# Patient Record
Sex: Female | Born: 1990 | Hispanic: Yes | Marital: Single | Smoking: Never smoker
Health system: Southern US, Community
[De-identification: ages and names within clinical notes are randomized; demographics above are authoritative.]

## PROBLEM LIST (undated history)

## (undated) ENCOUNTER — Inpatient Hospital Stay: Payer: Self-pay

## (undated) DIAGNOSIS — N159 Renal tubulo-interstitial disease, unspecified: Secondary | ICD-10-CM

## (undated) DIAGNOSIS — Z3490 Encounter for supervision of normal pregnancy, unspecified, unspecified trimester: Secondary | ICD-10-CM

## (undated) HISTORY — PX: NO PAST SURGERIES: SHX2092

---
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
## 2015-07-14 NOTE — L&D Delivery Note (Signed)
Delivery Note At 9:46 PM a vigorous female infant was delivered via Vaginal, Spontaneous Delivery (Presentation: Left Occiput Anterior).  APGAR: 8, 9; weight  .   Placenta status: Intact, Spontaneous.  Cord: 3 vessels with the following complications: Short.    Anesthesia: Epidural  Episiotomy: None Lacerations: 2nd degree Suture Repair: rectal capsule was reinforced with 2-0 Vicryl and second degree repaired with 2-0 Vicryl and 3-0 Chromic Est. Blood Loss (mL): 450  Mom to postpartum.  Baby to Couplet care / Skin to Skin.  Naarah Borgerding 11/06/2015, 10:55 PM

## 2015-09-02 ENCOUNTER — Inpatient Hospital Stay
Admission: EM | Admit: 2015-09-02 | Discharge: 2015-09-02 | Disposition: A | Discharge status: Home / Self Care | Payer: Medicaid Other | Attending: Obstetrics and Gynecology | Admitting: Obstetrics and Gynecology

## 2015-09-02 DIAGNOSIS — O26893 Other specified pregnancy related conditions, third trimester: Secondary | ICD-10-CM | POA: Insufficient documentation

## 2015-09-02 DIAGNOSIS — Z3A31 31 weeks gestation of pregnancy: Secondary | ICD-10-CM | POA: Insufficient documentation

## 2015-09-02 DIAGNOSIS — M533 Sacrococcygeal disorders, not elsewhere classified: Secondary | ICD-10-CM | POA: Diagnosis not present

## 2015-09-02 DIAGNOSIS — M545 Low back pain: Secondary | ICD-10-CM | POA: Diagnosis present

## 2015-09-02 LAB — URINALYSIS COMPLETE WITH MICROSCOPIC (ARMC ONLY)
BILIRUBIN URINE: NEGATIVE
Bacteria, UA: NONE SEEN
GLUCOSE, UA: NEGATIVE mg/dL
Hgb urine dipstick: NEGATIVE
KETONES UR: NEGATIVE mg/dL
Nitrite: NEGATIVE
Protein, ur: NEGATIVE mg/dL
RBC / HPF: NONE SEEN RBC/hpf (ref 0–5)
Specific Gravity, Urine: 1.01 (ref 1.005–1.030)
pH: 7 (ref 5.0–8.0)

## 2015-09-03 NOTE — Final Progress Note (Signed)
Physician Final Progress Note  Patient ID: Dawn Simon MRN: [id number] DOB/AGE: 1991/05/28 25 y.o.  Admit date: 09/02/2015 Admitting provider: Vena Austria, MD Discharge date: 09/03/2015   Admission Diagnoses: Low back pain in pregnancy  Discharge Diagnoses:  Same as above-probably MSK in origin IUP at 31wk3d Consults:None  Significant Findings/ Diagnostic Studies: 25 year old G1 P0 with EDC=01 November 2015 by a 5wk6d ultrasound presents to L&D with c/o lower back pain since Friday, when she flew back from [place]. The pain is in the left SI joint and mid sacral area. Heat has not helped. Movement makes the pain worse. Has not taken any analgesics for the pain. Has had some urinary frequency, but no dysuria or bad odor to her urine. No hematuria or fever. No history of back injury and no recent falls. Medical history is significant for pyelonephritis resulting in a hospitalization x 1-2 weeks about 2 years ago. Has been feeling good FM. Has an occasional contraction. No vaginal bleeding or LOF. Prenatal care in [place] (has some records with her) has been uncomplicated. A recent scan reveals good growth. Current medications: Prenatal vitamins Exam: BP 115/60 mmHg  Pulse 82  Temp(Src) 98.4 F (36.9 C) (Oral)  Resp 16  Ht  (1.549 m)  Wt 76.204 kg (168 lb)  BMI 31.76 kg/m2  General: Appears comfortable until she moves, then has pain in her lower back Back: tenderness over her left SI joint and mid sacrum. No CVAT Abdomen: no tenderness FHR: 135 baseline with accelerations to 170s to 180s and moderate variability Toco: occasional contraction, one in the last hour Cervix: FT/40-50%/-2, blood on glove after exam Ultrasound: breech presentation Results for orders placed or performed during the hospital encounter of 09/02/15 (from the past 24 hour(s))  Urinalysis complete, with microscopic ([facility] only)     Status: Abnormal   Collection Time: 09/02/15  7:11 PM  Result Value Ref  Range   Color, Urine YELLOW (A) YELLOW   APPearance CLEAR (A) CLEAR   Glucose, UA NEGATIVE NEGATIVE mg/dL   Bilirubin Urine NEGATIVE NEGATIVE   Ketones, ur NEGATIVE NEGATIVE mg/dL   Specific Gravity, Urine 1.010 1.005 - 1.030   Hgb urine dipstick NEGATIVE NEGATIVE   pH 7.0 5.0 - 8.0   Protein, ur NEGATIVE NEGATIVE mg/dL   Nitrite NEGATIVE NEGATIVE   Leukocytes, UA 1+ (A) NEGATIVE   RBC / HPF NONE SEEN 0 - 5 RBC/hpf   WBC, UA 0-5 0 - 5 WBC/hpf   Bacteria, UA NONE SEEN NONE SEEN   Squamous Epithelial / LPF 0-5 (A) NONE SEEN   Mucous PRESENT   A: SI joint pain and sacral back pain at 31wk3d No evidence of labor Doubt UTI P: Discussed use of Tylenol, heat, maternity support garment and topical rubs like Biofreeze To cal WSOB in AM to transfer in care Urine culture pending  Procedures: NST: reactive  Discharge Condition: stable  Disposition: 81-Discharged to home/self-care with a planned acute care hospital inpt readmission  Diet: Regular diet  Discharge Activity: Activity as tolerated     Medication List    ASK your doctor about these medications        multivitamin-prenatal 27-0.8 MG Tabs tablet  Take 1 tablet by mouth daily at 12 noon.         Total time spent taking care of this patient: 20 minutes  Signed: Farrel Conners 09/03/2015, 3:37 AM

## 2015-09-04 LAB — URINE CULTURE

## 2015-11-05 ENCOUNTER — Observation Stay
Admission: EM | Admit: 2015-11-05 | Discharge: 2015-11-05 | Disposition: A | Discharge status: Home / Self Care | Payer: Medicaid Other | Source: Home / Self Care | Admitting: Obstetrics and Gynecology

## 2015-11-05 DIAGNOSIS — O471 False labor at or after 37 completed weeks of gestation: Secondary | ICD-10-CM

## 2015-11-05 DIAGNOSIS — Z3A40 40 weeks gestation of pregnancy: Secondary | ICD-10-CM | POA: Insufficient documentation

## 2015-11-05 DIAGNOSIS — O479 False labor, unspecified: Secondary | ICD-10-CM | POA: Diagnosis present

## 2015-11-05 HISTORY — DX: Renal tubulo-interstitial disease, unspecified: N15.9

## 2015-11-05 HISTORY — DX: Encounter for supervision of normal pregnancy, unspecified, unspecified trimester: Z34.90

## 2015-11-05 NOTE — OB Triage Note (Signed)
R/o labor, painful contraction since yesterday, becoming more painful, more frequent and steady today. Cervix yesterday in clinic 1cm. GBS unknown.

## 2015-11-05 NOTE — Final Progress Note (Signed)
Physician Final Progress Note  Patient ID: Alphonsa OverallSilvia Pinney MRN: [id number] DOB/AGE: 25/12/1990 25 y.o.  Admit date: 11/05/2015 Admitting provider: Vena AustriaAndreas Veto Macqueen, MD Discharge date: 11/05/2015   Admission Diagnoses: Round ligament pain, irregular contractions  Discharge Diagnoses:  Active Problems:   Irregular contractions   No cervical change over 2-hrs per nursing check, irregular contractions, reactive NST.  Has IOL set for this Friday for postdates  Consults: None  Significant Findings/ Diagnostic Studies: none  Procedures: reactive NST  Discharge Condition: good  Disposition: 81-Discharged to home/self-care with a planned acute care hospital inpt readmission  Diet: Regular diet  Discharge Activity: Activity as tolerated  Discharge Instructions    Discharge diet:  No restrictions    Complete by:  As directed      Fetal Kick Count:  Lie on our left side for one hour after a meal, and count the number of times your baby kicks.  If it is less than 5 times, get up, move around and drink some juice.  Repeat the test 30 minutes later.  If it is still less than 5 kicks in an hour, notify your doctor.    Complete by:  As directed      LABOR:  When conractions begin, you should start to time them from the beginning of one contraction to the beginning  of the next.  When contractions are 5 - 10 minutes apart or less and have been regular for at least an hour, you should call your health care provider.    Complete by:  As directed      No sexual activity restrictions    Complete by:  As directed      Notify physician for bleeding from the vagina    Complete by:  As directed      Notify physician for blurring of vision or spots before the eyes    Complete by:  As directed      Notify physician for chills or fever    Complete by:  As directed      Notify physician for fainting spells, "black outs" or loss of consciousness    Complete by:  As directed      Notify physician for  increase in vaginal discharge    Complete by:  As directed      Notify physician for leaking of fluid    Complete by:  As directed      Notify physician for pain or burning when urinating    Complete by:  As directed      Notify physician for pelvic pressure (sudden increase)    Complete by:  As directed      Notify physician for severe or continued nausea or vomiting    Complete by:  As directed      Notify physician for sudden gushing of fluid from the vagina (with or without continued leaking)    Complete by:  As directed      Notify physician for sudden, constant, or occasional abdominal pain    Complete by:  As directed      Notify physician if baby moving less than usual    Complete by:  As directed             Medication List    TAKE these medications        multivitamin-prenatal 27-0.8 MG Tabs tablet  Take 1 tablet by mouth daily at 12 noon.         Total time spent taking care  of this patient: 5 minutes, patient triaged over phone  Signed: Lorrene Reid 11/05/2015, 9:13 PM

## 2015-11-05 NOTE — Progress Notes (Signed)
[facility]/o painful [facility], says she has been contracting since yesterday after seeing CNM in clinic, noticed spotting yesterday after visit, cervix was 1cm. Continued having painful contraction overnight, about every 10-20 mins, pt denies taking any med or doing anything to ease discomfort. Woke this morning and pain in lower abdomen and pelvis is "steady" worse and coming more frequently but still irregular, nausea, no vomiting, rates pain 5-7/10. Pt confirms +FM, denies vaginal bleeding or leaking fluid, GBS unknown. Says she is scheduled for IOL this Friday, postdates. G1P0, EDD 11/01/15

## 2015-11-05 NOTE — Discharge Instructions (Signed)
Get plenty of water and rest. Discharge instructions given, pain relief measures given and patient stated understanding. Pt has scheduled induction for Friday 11/08/2015

## 2015-11-06 ENCOUNTER — Inpatient Hospital Stay: Payer: Medicaid Other | Admitting: Anesthesiology

## 2015-11-06 ENCOUNTER — Inpatient Hospital Stay
Admission: EM | Admit: 2015-11-06 | Discharge: 2015-11-08 | DRG: 775 | Disposition: A | Discharge status: Home / Self Care | Payer: Medicaid Other | Attending: Certified Nurse Midwife | Admitting: Certified Nurse Midwife

## 2015-11-06 DIAGNOSIS — O480 Post-term pregnancy: Principal | ICD-10-CM | POA: Diagnosis present

## 2015-11-06 DIAGNOSIS — Z3A40 40 weeks gestation of pregnancy: Secondary | ICD-10-CM

## 2015-11-06 LAB — TYPE AND SCREEN
ABO/RH(D): O POS
ANTIBODY SCREEN: NEGATIVE

## 2015-11-06 LAB — CBC
HEMATOCRIT: 34.6 % — AB (ref 35.0–47.0)
Hemoglobin: 11.2 g/dL — ABNORMAL LOW (ref 12.0–16.0)
MCH: 22.6 pg — ABNORMAL LOW (ref 26.0–34.0)
MCHC: 32.4 g/dL (ref 32.0–36.0)
MCV: 69.8 fL — ABNORMAL LOW (ref 80.0–100.0)
Platelets: 202 10*3/uL (ref 150–440)
RBC: 4.95 MIL/uL (ref 3.80–5.20)
RDW: 17.3 % — AB (ref 11.5–14.5)
WBC: 11.1 10*3/uL — AB (ref 3.6–11.0)

## 2015-11-06 LAB — CHLAMYDIA/NGC RT PCR (ARMC ONLY)
Chlamydia Tr: NOT DETECTED
N GONORRHOEAE: NOT DETECTED

## 2015-11-06 LAB — ABO/RH: ABO/RH(D): O POS

## 2015-11-06 MED ORDER — FENTANYL 2.5 MCG/ML W/ROPIVACAINE 0.2% IN NS 100 ML EPIDURAL INFUSION (ARMC-ANES)
10.0000 mL/h | EPIDURAL | Status: DC
  Administered 2015-11-06: 10 mL/h via EPIDURAL
  Filled 2015-11-06 (×2): qty 100

## 2015-11-06 MED ORDER — AMMONIA AROMATIC IN INHA
RESPIRATORY_TRACT | Status: AC
  Filled 2015-11-06: qty 10

## 2015-11-06 MED ORDER — BUPIVACAINE HCL (PF) 0.25 % IJ SOLN
INTRAMUSCULAR | Status: DC | PRN
  Administered 2015-11-06: 10 mL via EPIDURAL

## 2015-11-06 MED ORDER — EPHEDRINE 5 MG/ML INJ
10.0000 mg | INTRAVENOUS | Status: DC | PRN
  Filled 2015-11-06: qty 2

## 2015-11-06 MED ORDER — LACTATED RINGERS IV SOLN: 500.0000 mL | Freq: Once | INTRAVENOUS | Status: DC

## 2015-11-06 MED ORDER — BUTORPHANOL TARTRATE 1 MG/ML IJ SOLN
1.0000 mg | INTRAMUSCULAR | Status: DC | PRN
  Administered 2015-11-06: 2 mg via INTRAVENOUS
  Filled 2015-11-06: qty 2

## 2015-11-06 MED ORDER — LIDOCAINE HCL (PF) 1 % IJ SOLN
INTRAMUSCULAR | Status: AC
  Filled 2015-11-06: qty 30

## 2015-11-06 MED ORDER — OXYTOCIN BOLUS FROM INFUSION
500.0000 mL | INTRAVENOUS | Status: DC
  Administered 2015-11-06: 500 mL via INTRAVENOUS

## 2015-11-06 MED ORDER — DIPHENHYDRAMINE HCL 50 MG/ML IJ SOLN: 12.5000 mg | INTRAMUSCULAR | Status: DC | PRN

## 2015-11-06 MED ORDER — OXYTOCIN 10 UNIT/ML IJ SOLN
INTRAMUSCULAR | Status: AC
  Filled 2015-11-06: qty 2

## 2015-11-06 MED ORDER — MISOPROSTOL 200 MCG PO TABS
ORAL_TABLET | ORAL | Status: AC
  Filled 2015-11-06: qty 4

## 2015-11-06 MED ORDER — PHENYLEPHRINE 40 MCG/ML (10ML) SYRINGE FOR IV PUSH (FOR BLOOD PRESSURE SUPPORT)
80.0000 ug | PREFILLED_SYRINGE | INTRAVENOUS | Status: DC | PRN
  Filled 2015-11-06: qty 5

## 2015-11-06 MED ORDER — ACETAMINOPHEN 325 MG PO TABS
650.0000 mg | ORAL_TABLET | ORAL | Status: DC | PRN
Start: 2015-11-06 — End: 2015-11-08

## 2015-11-06 MED ORDER — ONDANSETRON HCL 4 MG/2ML IJ SOLN: 4.0000 mg | Freq: Four times a day (QID) | INTRAMUSCULAR | Status: DC | PRN

## 2015-11-06 MED ORDER — CITRIC ACID-SODIUM CITRATE 334-500 MG/5ML PO SOLN: 30.0000 mL | ORAL | Status: DC | PRN

## 2015-11-06 MED ORDER — LIDOCAINE HCL (PF) 1 % IJ SOLN: 30.0000 mL | INTRAMUSCULAR | Status: DC | PRN

## 2015-11-06 MED ORDER — OXYTOCIN 40 UNITS IN LACTATED RINGERS INFUSION - SIMPLE MED
2.5000 [IU]/h | INTRAVENOUS | Status: DC
  Filled 2015-11-06: qty 1000

## 2015-11-06 MED ORDER — LACTATED RINGERS IV SOLN
INTRAVENOUS | Status: DC
  Administered 2015-11-06 (×2): via INTRAVENOUS

## 2015-11-06 MED ORDER — BUTORPHANOL TARTRATE 1 MG/ML IJ SOLN
1.0000 mg | INTRAMUSCULAR | Status: DC | PRN
  Administered 2015-11-06 (×2): 1 mg via INTRAVENOUS
  Filled 2015-11-06 (×2): qty 1

## 2015-11-06 MED ORDER — LIDOCAINE-EPINEPHRINE (PF) 1.5 %-1:200000 IJ SOLN
INTRAMUSCULAR | Status: DC | PRN
  Administered 2015-11-06: 3 mL via PERINEURAL

## 2015-11-06 MED ORDER — LACTATED RINGERS IV SOLN: 500.0000 mL | INTRAVENOUS | Status: DC | PRN

## 2015-11-06 NOTE — Anesthesia Procedure Notes (Signed)
Epidural  Start time: 11/06/2015 6:18 PM End time: 11/06/2015 6:25 PM  Staffing Anesthesiologist: Yves DillARROLL, Johnmatthew Solorio Performed by: anesthesiologist   Preanesthetic Checklist Completed: patient identified, site marked, surgical consent, pre-op evaluation, timeout performed, IV checked, risks and benefits discussed and monitors and equipment checked  Epidural Patient position: sitting Prep: Betadine and site prepped and draped Patient monitoring: heart rate, cardiac monitor, continuous pulse ox and blood pressure Approach: midline Location: L3-L4 Injection technique: LOR air  Needle:  Needle type: Tuohy  Needle gauge: 18 G Needle length: 9 cm Catheter type: closed end Catheter size: 20 Guage Test dose: negative and 1.5% lidocaine with Epi 1:200 K  Assessment Sensory level: T8  Additional Notes Time out called.  Patient placed in sitting position.  Prepped and draped in sterile fashion.  A skin wheal was made in the L3-L4 interspace with 1% Lidocaine plain.  An 2818 G Tuohy needle was guided into the epidural space by a loss of resistance technique.  The catheter was threaded 3 cm and the TD was negative.  The catheter was affixed to the back in sterile fashion and the patient tolerated the procedure wellReason for block:procedure for pain

## 2015-11-06 NOTE — Anesthesia Preprocedure Evaluation (Addendum)
Anesthesia Evaluation  Patient identified by MRN, date of birth, ID band Patient awake    Reviewed: Allergy & Precautions, NPO status , Patient's Chart, lab work & pertinent test results  Airway Mallampati: II  TM Distance: >3 FB     Dental no notable dental hx.    Pulmonary neg pulmonary ROS,    Pulmonary exam normal        Cardiovascular negative cardio ROS Normal cardiovascular exam     Neuro/Psych negative neurological ROS  negative psych ROS   GI/Hepatic negative GI ROS, Neg liver ROS,   Endo/Other  negative endocrine ROS  Renal/GU Hx of UTI  negative genitourinary   Musculoskeletal negative musculoskeletal ROS (+)   Abdominal Normal abdominal exam  (+)   Peds negative pediatric ROS (+)  Hematology negative hematology ROS (+)   Anesthesia Other Findings   Reproductive/Obstetrics (+) Pregnancy                             Anesthesia Physical Anesthesia Plan  ASA: II  Anesthesia Plan: Epidural   Post-op Pain Management:    Induction: Intravenous  Airway Management Planned: Nasal Cannula  Additional Equipment:   Intra-op Plan:   Post-operative Plan:   Informed Consent: I have reviewed the patients History and Physical, chart, labs and discussed the procedure including the risks, benefits and alternatives for the proposed anesthesia with the patient or authorized representative who has indicated his/her understanding and acceptance.   Dental advisory given  Plan Discussed with: CRNA and Surgeon  Anesthesia Plan Comments:         Anesthesia Quick Evaluation

## 2015-11-06 NOTE — Progress Notes (Signed)
Assumed care from Joslyn HyJ. Maricle, RN.  Ambulating in room.  Breathing through contractions.  Uterine tone soft with adequate rest between contractions.  FHTs reactive.  Has no concerns or questions.  Reviewed plan of care.  Verbalized understanding and agrees with plan.

## 2015-11-06 NOTE — Progress Notes (Signed)
L&D Progress Note  S: Contractions feeling stronger  O: BP 131/79 mmHg  Pulse 76  Temp(Src) 99.3 F (37.4 C) (Oral)  Resp 18  Ht 5\' 2"  (1.575 m)  Wt 86.183 kg (190 lb)  BMI 34.74 kg/m2  General: up ambulating now; rested a little with Stadol  FHR: 125-130 with accelerations to 140s to 150, moderate variability Contractions: q3-5 min apart Cervix: 4.5/90%/-1  A: Some progress Cat 1 tracing  P: AROM for thin meconium stained amniotic fluid Monitor progress over next 2 hours-consider pitocin augmentation if no progress  Silvano Garofano, CNM

## 2015-11-06 NOTE — Discharge Summary (Signed)
Physician Obstetric Discharge Summary  Patient ID: Dawn OverallSilvia Simon MRN: [id number] DOB/AGE: 25/12/1990 25 y.o.   Date of Admission: 11/06/2015  Date of Discharge: 11/08/2015  Admitting Diagnosis: Onset of Labor at 3042w5d  Secondary Diagnosis: none  Mode of Delivery: normal spontaneous vaginal delivery 11/06/2015       Discharge Diagnosis: Term intrauterine pregnancy delivered,   Intrapartum Procedures: Atificial rupture of membranes, epidural and repair of second degree perineal laceration   Post partum procedures: none  Complications: none   Brief Hospital Course  Dawn Simon is a G1P1001 who had a SVD on 11/06/2015;  for further details of this delivery, please refer to the delivery note.  Patient had an uncomplicated postpartum course.  By time of discharge on PPD#2, her pain was controlled on oral pain medications; she had appropriate lochia and was ambulating, voiding without difficulty and tolerating regular diet.  She was deemed stable for discharge to home.     Labs: CBC Latest Ref Rng 11/07/2015 11/06/2015  WBC 3.6 - 11.0 K/uL 13.6(H) 11.1(H)  Hemoglobin 12.0 - 16.0 g/dL 4.0(J9.4(L) 11.2(L)  Hematocrit 35.0 - 47.0 % 29.2(L) 34.6(L)  Platelets 150 - 440 K/uL 189 202   O POS  Physical exam:  Blood pressure 109/57, pulse 80, temperature 98.5 F (36.9 C), temperature source Oral, resp. rate 18, height 5\' 2"  (1.575 m), weight 86.183 kg (190 lb), SpO2 99 %, breast/bottle General: alert and no distress Lochia: appropriate Abdomen: soft, NT Uterine Fundus: firm Incision: NA Extremities: No evidence of DVT seen on physical exam. No lower extremity edema.  Discharge Instructions: Per After Visit Summary. Activity: Advance as tolerated. Pelvic rest for 6 weeks.  Also refer to Discharge Instructions Diet: Regular Medications:   Medication List    TAKE these medications        multivitamin-prenatal 27-0.8 MG Tabs tablet  Take 1 tablet by mouth daily at 12 noon.        Outpatient follow up:  Follow-up Information    Follow up with GUTIERREZ, COLLEEN, CNM. Schedule an appointment as soon as possible for a visit in 6 weeks.   Specialty:  Certified Nurse Midwife   Contact information:   [address] [place] [id number] [phone]      Postpartum contraception: Nexplanon  Discharged Condition: good  Discharged to: home   Newborn Data: Disposition:home with mother  Apgars: APGAR (1 MIN): 8   APGAR (5 MINS): 9   APGAR (10 MINS):    Baby Feeding: Bottle and Breast/ Dawn Simon   Carrigan Delafuente, CNM

## 2015-11-06 NOTE — Progress Notes (Signed)
L&D Progress Note  25 yo G1 P0 now 40wk5 days who presented in labor during the night. Had progressed from 1cm to 3.5 cm since yesterday. Contractions started becoming more intense at 2200 last night.   S: Contractions a lot stronger and she would like some IV analgesia. Not interested in epidural at this time. Has been up walking most of the night.  O:BP 126/73 mmHg  Pulse 82  Temp(Src) 99.3 F (37.4 C) (Oral)  Resp 18  Ht 5\' 2"  (1.575 m)  Wt 86.183 kg (190 lb)  BMI 34.74 kg/m2  General: pleasant, but tired FHR: 140s with accelerations to 160s, moderate variability Toco: contractions q2-4 min apart Cervix: 4cm/90%/-1/vtx  A: IUP at 40 wk5d in early labor FWB: Cat1 tracing  P: Stadol for pain Expectant management  Jaedin Regina, CNM

## 2015-11-06 NOTE — H&P (Signed)
Prior paper H&P dated 11/04/2015 confirmed on chart and reviewed  Changes: 25 yo G1 at 2024w5d second presentation to L&D, initially did not make cervical change at 1cm, now 3cm on second presentation admit for term labor, GBS negative.

## 2015-11-07 LAB — CBC
HCT: 29.2 % — ABNORMAL LOW (ref 35.0–47.0)
Hemoglobin: 9.4 g/dL — ABNORMAL LOW (ref 12.0–16.0)
MCH: 22 pg — ABNORMAL LOW (ref 26.0–34.0)
MCHC: 32.1 g/dL (ref 32.0–36.0)
MCV: 68.6 fL — AB (ref 80.0–100.0)
PLATELETS: 189 10*3/uL (ref 150–440)
RBC: 4.25 MIL/uL (ref 3.80–5.20)
RDW: 17.3 % — ABNORMAL HIGH (ref 11.5–14.5)
WBC: 13.6 10*3/uL — AB (ref 3.6–11.0)

## 2015-11-07 LAB — GLUCOSE, CAPILLARY: GLUCOSE-CAPILLARY: 87 mg/dL (ref 65–99)

## 2015-11-07 LAB — RPR: RPR: NONREACTIVE

## 2015-11-07 MED ORDER — SIMETHICONE 80 MG PO CHEW: 80.0000 mg | CHEWABLE_TABLET | ORAL | Status: DC | PRN

## 2015-11-07 MED ORDER — ONDANSETRON HCL 4 MG/2ML IJ SOLN: 4.0000 mg | INTRAMUSCULAR | Status: DC | PRN

## 2015-11-07 MED ORDER — PRENATAL MULTIVITAMIN CH: 1.0000 | ORAL_TABLET | Freq: Every day | ORAL | Status: DC

## 2015-11-07 MED ORDER — FERROUS SULFATE 325 (65 FE) MG PO TABS
325.0000 mg | ORAL_TABLET | Freq: Every day | ORAL | Status: DC
  Administered 2015-11-07 – 2015-11-08 (×2): 325 mg via ORAL
  Filled 2015-11-07 (×2): qty 1

## 2015-11-07 MED ORDER — IBUPROFEN 600 MG PO TABS
600.0000 mg | ORAL_TABLET | Freq: Four times a day (QID) | ORAL | Status: DC | PRN
Start: 2015-11-07 — End: 2015-11-08
  Administered 2015-11-07 – 2015-11-08 (×3): 600 mg via ORAL
  Filled 2015-11-07 (×3): qty 1

## 2015-11-07 MED ORDER — WITCH HAZEL-GLYCERIN EX PADS: 1.0000 "application " | MEDICATED_PAD | CUTANEOUS | Status: DC | PRN

## 2015-11-07 MED ORDER — SENNOSIDES-DOCUSATE SODIUM 8.6-50 MG PO TABS
2.0000 | ORAL_TABLET | ORAL | Status: DC
  Administered 2015-11-08: 2 via ORAL
  Filled 2015-11-07: qty 2

## 2015-11-07 MED ORDER — DOCUSATE SODIUM 100 MG PO CAPS
100.0000 mg | ORAL_CAPSULE | Freq: Every day | ORAL | Status: DC
  Administered 2015-11-07: 100 mg via ORAL
  Filled 2015-11-07 (×2): qty 1

## 2015-11-07 MED ORDER — DIBUCAINE 1 % RE OINT: 1.0000 "application " | TOPICAL_OINTMENT | RECTAL | Status: DC | PRN

## 2015-11-07 MED ORDER — COCONUT OIL OIL: 1.0000 "application " | TOPICAL_OIL | Status: DC | PRN

## 2015-11-07 MED ORDER — TETANUS-DIPHTH-ACELL PERTUSSIS 5-2.5-18.5 LF-MCG/0.5 IM SUSP: 0.5000 mL | Freq: Once | INTRAMUSCULAR | Status: DC

## 2015-11-07 MED ORDER — ONDANSETRON HCL 4 MG PO TABS
4.0000 mg | ORAL_TABLET | ORAL | Status: DC | PRN
Start: 2015-11-07 — End: 2015-11-08

## 2015-11-07 MED ORDER — OXYCODONE-ACETAMINOPHEN 5-325 MG PO TABS
1.0000 | ORAL_TABLET | ORAL | Status: DC | PRN
  Administered 2015-11-07: 1 via ORAL
  Filled 2015-11-07: qty 1

## 2015-11-07 MED ORDER — BENZOCAINE-MENTHOL 20-0.5 % EX AERO: 1.0000 "application " | INHALATION_SPRAY | CUTANEOUS | Status: DC | PRN

## 2015-11-07 NOTE — Progress Notes (Signed)
Post Partum Day 1 Subjective: Doing well, no complaints.  Tolerating regular diet, pain with PO meds, voiding and ambulating without difficulty.  No CP SOB F/C N/V or leg pain No HA, change of vision, RUQ/epigastric pain  Objective: BP 101/51 mmHg  Pulse 77  Temp(Src) 98.7 F (37.1 C) (Oral)  Resp 20  Ht 5\' 2"  (1.575 m)  Wt 86.183 kg (190 lb)  BMI 34.74 kg/m2  Breastfeeding with assistance from Lactation  Physical Exam:  General: NAD CV: RRR Pulm: nl effort, CTABL Lochia: moderate Uterine Fundus: fundus firm and below umbilicus DVT Evaluation: no cords, ttp LEs    Recent Labs  11/06/15 0442 11/07/15 0447  HGB 11.2* 9.4*  HCT 34.6* 29.2*  WBC 11.1* 13.6*  PLT 202 189    Assessment/Plan: 25 y.o. G1P1001 postpartum day 1  1. Continue routine postpartum care 2. Breastfeeding 3. Disposition: DC to home tomorrow    Tresea MallGLEDHILL,Toddrick Sanna, CNM

## 2015-11-07 NOTE — Lactation Note (Signed)
This note was copied from a baby's chart. Lactation Consultation Note  Patient Name: Dawn Alphonsa OverallSilvia Lantis EAVWU'JToday's Date: 11/07/2015 Reason for consult: Initial assessment   Maternal Data Formula Feeding for Exclusion: Yes Reason for exclusion: Mother's choice to formula feed on admision  Feeding Feeding Type: Breast Fed  LATCH Score/Interventions Latch: Grasps breast easily, tongue down, lips flanged, rhythmical sucking.  Audible Swallowing: Spontaneous and intermittent  Type of Nipple: Inverted  Comfort (Breast/Nipple): Soft / non-tender     Hold (Positioning): No assistance needed to correctly position infant at breast.  LATCH Score: 8  Lactation Tools Discussed/Used     Consult Status      Dawn Simon 11/07/2015, 11:35 AM

## 2015-11-07 NOTE — Anesthesia Postprocedure Evaluation (Signed)
Anesthesia Post Note  Patient: Dawn Simon  Procedure(s) Performed: * No procedures listed *  Patient location during evaluation: Mother Baby Anesthesia Type: Epidural Level of consciousness: awake and alert and oriented Pain management: pain level controlled Vital Signs Assessment: post-procedure vital signs reviewed and stable Respiratory status: spontaneous breathing Cardiovascular status: stable Postop Assessment: no headache Anesthetic complications: no    Last Vitals:  Filed Vitals:   11/07/15 0030 11/07/15 0406  BP: 111/55 103/45  Pulse: 79 72  Temp: 36.8 C 36.8 C  Resp: 20 20    Last Pain:  Filed Vitals:   11/07/15 0418  PainSc: 0-No pain                 Niralya Ohanian,  Alessandra BevelsJennifer M

## 2015-11-08 NOTE — Progress Notes (Signed)
Pt discharged home with infant.  Discharge instructions and follow up appointment given to and reviewed with pt.  Pt verbalized understanding.  Escorted by auxillary. 

## 2015-12-25 LAB — HM PAP SMEAR

## 2016-04-12 ENCOUNTER — Encounter: Payer: Self-pay | Admitting: Emergency Medicine

## 2016-04-12 ENCOUNTER — Emergency Department: Payer: Medicaid Other

## 2016-04-12 ENCOUNTER — Observation Stay
Admission: EM | Admit: 2016-04-12 | Discharge: 2016-04-14 | Disposition: A | Discharge status: Home / Self Care | Payer: Medicaid Other | Attending: Internal Medicine | Admitting: Internal Medicine

## 2016-04-12 DIAGNOSIS — R109 Unspecified abdominal pain: Secondary | ICD-10-CM

## 2016-04-12 DIAGNOSIS — R110 Nausea: Secondary | ICD-10-CM

## 2016-04-12 DIAGNOSIS — R10A2 Flank pain, left side: Secondary | ICD-10-CM

## 2016-04-12 DIAGNOSIS — A419 Sepsis, unspecified organism: Secondary | ICD-10-CM | POA: Insufficient documentation

## 2016-04-12 DIAGNOSIS — Z23 Encounter for immunization: Secondary | ICD-10-CM | POA: Insufficient documentation

## 2016-04-12 DIAGNOSIS — N12 Tubulo-interstitial nephritis, not specified as acute or chronic: Principal | ICD-10-CM

## 2016-04-12 DIAGNOSIS — Z87440 Personal history of urinary (tract) infections: Secondary | ICD-10-CM | POA: Diagnosis not present

## 2016-04-12 DIAGNOSIS — N10 Acute tubulo-interstitial nephritis: Secondary | ICD-10-CM | POA: Insufficient documentation

## 2016-04-12 LAB — CBC
HCT: 42.8 % (ref 35.0–47.0)
Hemoglobin: 13.9 g/dL (ref 12.0–16.0)
MCH: 23.9 pg — AB (ref 26.0–34.0)
MCHC: 32.5 g/dL (ref 32.0–36.0)
MCV: 73.6 fL — ABNORMAL LOW (ref 80.0–100.0)
Platelets: 223 10*3/uL (ref 150–440)
RBC: 5.81 MIL/uL — ABNORMAL HIGH (ref 3.80–5.20)
RDW: 16.3 % — AB (ref 11.5–14.5)
WBC: 13.9 10*3/uL — ABNORMAL HIGH (ref 3.6–11.0)

## 2016-04-12 LAB — URINALYSIS COMPLETE WITH MICROSCOPIC (ARMC ONLY)
Bilirubin Urine: NEGATIVE
Glucose, UA: NEGATIVE mg/dL
HGB URINE DIPSTICK: NEGATIVE
KETONES UR: NEGATIVE mg/dL
NITRITE: NEGATIVE
PH: 5 (ref 5.0–8.0)
PROTEIN: NEGATIVE mg/dL
SPECIFIC GRAVITY, URINE: 1.021 (ref 1.005–1.030)

## 2016-04-12 LAB — BASIC METABOLIC PANEL
Anion gap: 8 (ref 5–15)
BUN: 9 mg/dL (ref 6–20)
CALCIUM: 8.6 mg/dL — AB (ref 8.9–10.3)
CO2: 22 mmol/L (ref 22–32)
CREATININE: 0.69 mg/dL (ref 0.44–1.00)
Chloride: 107 mmol/L (ref 101–111)
GFR calc Af Amer: >60 mL/min (ref >60)
GLUCOSE: 131 mg/dL — AB (ref 65–99)
Potassium: 3.5 mmol/L (ref 3.5–5.1)
Sodium: 137 mmol/L (ref 135–145)

## 2016-04-12 LAB — PREGNANCY, URINE: Preg Test, Ur: NEGATIVE

## 2016-04-12 LAB — LACTIC ACID, PLASMA
LACTIC ACID, VENOUS: 1 mmol/L (ref 0.5–1.9)
LACTIC ACID, VENOUS: 0.7 mmol/L (ref 0.5–1.9)

## 2016-04-12 MED ORDER — ONDANSETRON HCL 4 MG/2ML IJ SOLN
4.0000 mg | Freq: Four times a day (QID) | INTRAMUSCULAR | Status: DC | PRN
  Administered 2016-04-13: 05:00:00 4 mg via INTRAVENOUS

## 2016-04-12 MED ORDER — DEXTROSE 5 % IV SOLN
1.0000 g | Freq: Once | INTRAVENOUS | Status: AC
  Administered 2016-04-12: 1 g via INTRAVENOUS
  Filled 2016-04-12: qty 10

## 2016-04-12 MED ORDER — HYDROCODONE-ACETAMINOPHEN 5-325 MG PO TABS
1.0000 | ORAL_TABLET | ORAL | Status: DC | PRN
  Administered 2016-04-12: 19:00:00 1 via ORAL
  Administered 2016-04-13: 04:00:00 2 via ORAL
  Administered 2016-04-13 – 2016-04-14 (×2): 1 via ORAL
  Filled 2016-04-12 (×2): qty 1
  Filled 2016-04-12: qty 2
  Filled 2016-04-12: qty 1

## 2016-04-12 MED ORDER — POTASSIUM CHLORIDE IN NACL 20-0.9 MEQ/L-% IV SOLN
INTRAVENOUS | Status: DC
  Administered 2016-04-12 – 2016-04-14 (×4): via INTRAVENOUS
  Filled 2016-04-12 (×7): qty 1000

## 2016-04-12 MED ORDER — DEXTROSE 5 % IV SOLN
1.0000 g | Freq: Every day | INTRAVENOUS | Status: DC
  Administered 2016-04-13 – 2016-04-14 (×2): 1 g via INTRAVENOUS
  Filled 2016-04-12 (×2): qty 10

## 2016-04-12 MED ORDER — ACETAMINOPHEN 500 MG PO TABS
1000.0000 mg | ORAL_TABLET | Freq: Once | ORAL | Status: AC
  Administered 2016-04-12: 1000 mg via ORAL
  Filled 2016-04-12: qty 2

## 2016-04-12 MED ORDER — ONDANSETRON HCL 4 MG/2ML IJ SOLN
4.0000 mg | Freq: Once | INTRAMUSCULAR | Status: AC
  Administered 2016-04-12: 4 mg via INTRAVENOUS
  Filled 2016-04-12: qty 2

## 2016-04-12 MED ORDER — PHENOL 1.4 % MT LIQD
1.0000 | OROMUCOSAL | Status: DC | PRN
  Administered 2016-04-14: 10:00:00 1 via OROMUCOSAL
  Filled 2016-04-12: qty 177

## 2016-04-12 MED ORDER — MORPHINE SULFATE (PF) 2 MG/ML IV SOLN
2.0000 mg | INTRAVENOUS | Status: DC | PRN
  Administered 2016-04-12: 2 mg via INTRAVENOUS
  Filled 2016-04-12: qty 1

## 2016-04-12 MED ORDER — BISACODYL 10 MG RE SUPP
10.0000 mg | Freq: Every day | RECTAL | Status: DC | PRN
Start: 2016-04-12 — End: 2016-04-14

## 2016-04-12 MED ORDER — FAMOTIDINE IN NACL 20-0.9 MG/50ML-% IV SOLN
20.0000 mg | Freq: Two times a day (BID) | INTRAVENOUS | Status: DC
  Administered 2016-04-12 – 2016-04-14 (×5): 20 mg via INTRAVENOUS
  Filled 2016-04-12 (×8): qty 50

## 2016-04-12 MED ORDER — DOCUSATE SODIUM 100 MG PO CAPS
100.0000 mg | ORAL_CAPSULE | Freq: Two times a day (BID) | ORAL | Status: DC
  Administered 2016-04-12 – 2016-04-14 (×4): 100 mg via ORAL
  Filled 2016-04-12 (×5): qty 1

## 2016-04-12 MED ORDER — SODIUM CHLORIDE 0.9 % IV BOLUS (SEPSIS)
1000.0000 mL | Freq: Once | INTRAVENOUS | Status: AC
  Administered 2016-04-12: 1000 mL via INTRAVENOUS

## 2016-04-12 MED ORDER — INFLUENZA VAC SPLIT QUAD 0.5 ML IM SUSY
0.5000 mL | PREFILLED_SYRINGE | INTRAMUSCULAR | Status: AC
  Administered 2016-04-13: 0.5 mL via INTRAMUSCULAR
  Filled 2016-04-12: qty 0.5

## 2016-04-12 MED ORDER — ACETAMINOPHEN 325 MG PO TABS
650.0000 mg | ORAL_TABLET | Freq: Four times a day (QID) | ORAL | Status: DC | PRN
  Administered 2016-04-12 – 2016-04-13 (×2): 650 mg via ORAL
  Filled 2016-04-12 (×2): qty 2

## 2016-04-12 MED ORDER — HEPARIN SODIUM (PORCINE) 5000 UNIT/ML IJ SOLN
5000.0000 [IU] | Freq: Three times a day (TID) | INTRAMUSCULAR | Status: DC
  Administered 2016-04-12 – 2016-04-13 (×3): 5000 [IU] via SUBCUTANEOUS
  Filled 2016-04-12 (×3): qty 1

## 2016-04-12 MED ORDER — ONDANSETRON HCL 4 MG PO TABS: 4.0000 mg | ORAL_TABLET | Freq: Four times a day (QID) | ORAL | Status: DC | PRN

## 2016-04-12 MED ORDER — MORPHINE SULFATE (PF) 4 MG/ML IV SOLN
4.0000 mg | Freq: Once | INTRAVENOUS | Status: AC
  Administered 2016-04-12: 4 mg via INTRAVENOUS
  Filled 2016-04-12: qty 1

## 2016-04-12 MED ORDER — ACETAMINOPHEN 650 MG RE SUPP: 650.0000 mg | Freq: Four times a day (QID) | RECTAL | Status: DC | PRN

## 2016-04-12 NOTE — ED Notes (Signed)
Pt presents with left flank pain, urinary frequency and pain x 2 days. Pt states she had a kidney infection 4 years ago that landed her in the hospital for 4 days. Pt states that she developed a fever last night, as well as swelling to the neck. She has had nausea without vomiting this morning. Denies hematuria. NAD noted.

## 2016-04-12 NOTE — H&P (Signed)
History and Physical    Dawn Simon Hires EXB:[id number] DOB: 06/04/1991 DOA: 04/12/2016  Referring physician: Dr. Shaune PollackLord PCP: No primary care provider on file.  Specialists: none  Chief Complaint: fever with flank pain  HPI: Dawn Simon Aiken is a 25 y.o. female has a past medical history significant for recurrent UTI's now with fever, nausea, and severe left flank pain. In ER, WBC elevated and pt was febrile. UA grossly abnormal. She is now admitted. No vomiting or diarrhea. Denies CP or SOB.  Review of Systems: The patient denies anorexia, weight loss,, vision loss, decreased hearing, hoarseness, chest pain, syncope, dyspnea on exertion, peripheral edema, balance deficits, hemoptysis,  melena, hematochezia, severe indigestion/heartburn, hematuria, incontinence, genital sores, muscle weakness, suspicious skin lesions, transient blindness, difficulty walking, depression, unusual weight change, abnormal bleeding, enlarged lymph nodes, angioedema, and breast masses.   Past Medical History:  Diagnosis Date  . Kidney infection    2 yrs ago  . Normal pregnancy    Past Surgical History:  Procedure Laterality Date  . NO PAST SURGERIES     Social History:  reports that she has never smoked. She has never used smokeless tobacco. She reports that she does not drink alcohol or use drugs.  No Known Allergies  Family History  Problem Relation Age of Onset  . Hypertension Maternal Grandmother   . Diabetes Paternal Grandfather     Prior to Admission medications   Not on File   Physical Exam: Vitals:   04/12/16 0750 04/12/16 0751  BP: 119/69   Pulse: (!) 130   Temp: 100.3 F (37.9 C)   TempSrc: Oral   SpO2: 96%   Weight:  83.9 kg (185 lb)  Height:  5\' 3"  (1.6 m)     General:  No apparent distress, WDWN, [place]/AT  Eyes: PERRL, EOMI, no scleral icterus, conjunctiva clear  ENT: moist oropharynx without exudate, TM's benign, dentition good  Neck: supple, no lymphadenopathy. No bruits or  thyromegaly  Cardiovascular: regular rate without MRG; 2+ peripheral pulses, no JVD, no peripheral edema  Respiratory: CTA biL, good air movement without wheezing, rhonchi or crackled. Respiratory effort normal  Abdomen: soft, non tender to palpation, positive bowel sounds, no guarding, no rebound. Positive left CVA tenderness noted  Skin: no rashes or lesions  Musculoskeletal: normal bulk and tone, no joint swelling  Psychiatric: normal mood and affect, A&OX3  Neurologic: CN 2-12 grossly intact, Motor strength 5/5 in all 4 groups with symmetric DTR's and non-focal sensory exam  Labs on Admission:  Basic Metabolic Panel:  Recent Labs Lab 04/12/16 0805  NA 137  K 3.5  CL 107  CO2 22  GLUCOSE 131*  BUN 9  CREATININE 0.69  CALCIUM 8.6*   Liver Function Tests: No results for input(s): AST, ALT, ALKPHOS, BILITOT, PROT, ALBUMIN in the last 168 hours. No results for input(s): LIPASE, AMYLASE in the last 168 hours. No results for input(s): AMMONIA in the last 168 hours. CBC:  Recent Labs Lab 04/12/16 0805  WBC 13.9*  HGB 13.9  HCT 42.8  MCV 73.6*  PLT 223   Cardiac Enzymes: No results for input(s): CKTOTAL, CKMB, CKMBINDEX, TROPONINI in the last 168 hours.  BNP (last 3 results) No results for input(s): BNP in the last 8760 hours.  ProBNP (last 3 results) No results for input(s): PROBNP in the last 8760 hours.  CBG: No results for input(s): GLUCAP in the last 168 hours.  Radiological Exams on Admission: No results found.  EKG: Independently reviewed.  Assessment/Plan  Principal Problem:   Pyelonephritis Active Problems:   History of recurrent UTI (urinary tract infection)   Left flank pain   Nausea   Will observe on floor with IV fluids and IV ABX. Cultures sent. Prn morphine and Zofran ordered. Repeat labs in AM. Renal US due to recurrent UTI's.  Diet: clear liquids Fluids: NS@100  DVT Prophylaxis: SQ Heparin  Code Status: FULL  Family  Communication: yes  Disposition Plan: home  Time spent: 50 min

## 2016-04-12 NOTE — ED Provider Notes (Signed)
[facility] Emergency Department Provider Note ____________________________________________   I have reviewed the triage vital signs and the triage nursing note.  HISTORY  Chief Complaint Flank Pain   Historian Patient  HPI Dawn Simon is a 25 y.o. female with a history of prior pyelonephritis, a few years ago which required hospital admission, presents today with fever, and bilateral flank pain which started on Friday, and was worse overnight with sweats and fever to 102. Mild dysuria. Mild nausea without vomiting.Moderate flank pain, and headache.  Nothing makes it worse or better.    Past Medical History:  Diagnosis Date  . Kidney infection    2 yrs ago  . Normal pregnancy     Patient Active Problem List   Diagnosis Date Noted  . Postpartum care following vaginal delivery 11/08/2015  . Labor and delivery, indication for care 11/06/2015  . Irregular contractions 11/05/2015    Past Surgical History:  Procedure Laterality Date  . NO PAST SURGERIES      Prior to Admission medications   Not on File    No Known Allergies  Family History  Problem Relation Age of Onset  . Hypertension Maternal Grandmother   . Diabetes Paternal Grandfather     Social History Social History  Substance Use Topics  . Smoking status: Never Smoker  . Smokeless tobacco: Not on file  . Alcohol use No    Review of Systems  Constitutional: Positive for fever. Eyes: Negative for visual changes. ENT: Positive for sore throat. Cardiovascular: Negative for chest pain. Respiratory: Negative for shortness of breath. Gastrointestinal: Negative for diarrhea. Genitourinary: Positive for mild dysuria. Musculoskeletal: Positive for back pain. Skin: Negative for rash. Neurological: Positive for headache. 10 point Review of Systems otherwise negative ____________________________________________   PHYSICAL EXAM:  VITAL SIGNS: ED Triage Vitals  Enc Vitals  Group     BP 04/12/16 0750 119/69     Pulse Rate 04/12/16 0750 (!) 130     Resp --      Temp 04/12/16 0750 100.3 F (37.9 C)     Temp Source 04/12/16 0750 Oral     SpO2 04/12/16 0750 96 %     Weight 04/12/16 0751 185 lb (83.9 kg)     Height 04/12/16 0751 5\' 3"  (1.6 m)     Head Circumference --      Peak Flow --      Pain Score 04/12/16 0753 7     Pain Loc --      Pain Edu? --      Excl. in GC? --      Constitutional: Alert and oriented. Well appearing and in no distress. HEENT   Head: Normocephalic and atraumatic.      Eyes: Conjunctivae are normal. PERRL. Normal extraocular movements.      Ears:         Nose: No congestion/rhinnorhea.   Mouth/Throat: Mucous membranes are moist.   Neck: No stridor.  No neck stiffness, neck is supple. Cardiovascular/Chest: Normal rate, regular rhythm.  No murmurs, rubs, or gallops. Respiratory: Normal respiratory effort without tachypnea nor retractions. Breath sounds are clear and equal bilaterally. No wheezes/rales/rhonchi. Gastrointestinal: Soft. No distention, no guarding, no rebound. Nontender.  Genitourinary/rectal:Deferred Musculoskeletal: Nontender with normal range of motion in all extremities. No joint effusions.  No lower extremity tenderness.  No edema. Neurologic:  Normal speech and language. No gross or focal neurologic deficits are appreciated. Skin:  Skin is warm, dry and intact. No rash noted. Psychiatric: Mood and  affect are normal. Speech and behavior are normal. Patient exhibits appropriate insight and judgment.   ____________________________________________  LABS (pertinent positives/negatives)  Labs Reviewed  URINALYSIS COMPLETEWITH MICROSCOPIC ([facility] ONLY) - Abnormal; Notable for the following:       Result Value   Color, Urine YELLOW (*)    APPearance HAZY (*)    Leukocytes, UA 3+ (*)    Bacteria, UA RARE (*)    Squamous Epithelial / LPF 6-30 (*)    All other components within normal limits  BASIC  METABOLIC PANEL - Abnormal; Notable for the following:    Glucose, Bld 131 (*)    Calcium 8.6 (*)    All other components within normal limits  CBC - Abnormal; Notable for the following:    WBC 13.9 (*)    RBC 5.81 (*)    MCV 73.6 (*)    MCH 23.9 (*)    RDW 16.3 (*)    All other components within normal limits  LACTIC ACID, PLASMA  LACTIC ACID, PLASMA  PREGNANCY, URINE    ____________________________________________    EKG I, Governor Rooks, MD, the attending physician have personally viewed and interpreted all ECGs.  None ____________________________________________  RADIOLOGY All Xrays were viewed by me. Imaging interpreted by Radiologist.  None __________________________________________  PROCEDURES  Procedure(s) performed: None  Critical Care performed: None  ____________________________________________   ED COURSE / ASSESSMENT AND PLAN  Pertinent labs & imaging results that were available during my care of the patient were reviewed by me and considered in my medical decision making (see chart for details).   Dawn Simon is here with symptoms clinically concerning for pyelonephritis. On laboratory evaluation, I discussed consistent with pyelonephritis, possible early sepsis with elevated white blood cell count, fever, UTI, and tachycardia. No hypotension or elevated lactate. Patient was given 1 L normal saline bolus initially.  Rocephin antibiotic started after blood and urine cultures. Tylenol given for fever. Morphine and Zofran given for pain. I discussed with hospitalist for observation admission.    CONSULTATIONS:  Dr. Zorita Pang, hospitalist for admission.   Patient / Family / Caregiver informed of clinical course, medical decision-making process, and agree with plan.  ___________________________________________   FINAL CLINICAL IMPRESSION(S) / ED DIAGNOSES   Final diagnoses:  Pyelonephritis  Sepsis, unspecified organism [facility])               Note: This dictation was prepared with Dragon dictation. Any transcriptional errors that result from this process are unintentional    Governor Rooks, MD 04/12/16 [phone]

## 2016-04-12 NOTE — ED Notes (Signed)
Pt transported to ultrasound.

## 2016-04-12 NOTE — ED Triage Notes (Addendum)
Pt with left flank pain, fever, urinary frequency and urgency, and swollen lymph glands since yesterday. States that this feels same as past uti. Pt breastfeeding

## 2016-04-13 LAB — CBC
HCT: 37.2 % (ref 35.0–47.0)
Hemoglobin: 12.2 g/dL (ref 12.0–16.0)
MCH: 24.1 pg — AB (ref 26.0–34.0)
MCHC: 32.8 g/dL (ref 32.0–36.0)
MCV: 73.3 fL — AB (ref 80.0–100.0)
PLATELETS: 194 10*3/uL (ref 150–440)
RBC: 5.07 MIL/uL (ref 3.80–5.20)
RDW: 16.3 % — AB (ref 11.5–14.5)
WBC: 13.5 10*3/uL — ABNORMAL HIGH (ref 3.6–11.0)

## 2016-04-13 LAB — COMPREHENSIVE METABOLIC PANEL
ALK PHOS: 102 U/L (ref 38–126)
ALT: 68 U/L — AB (ref 14–54)
AST: 23 U/L (ref 15–41)
Albumin: 3.5 g/dL (ref 3.5–5.0)
Anion gap: 7 (ref 5–15)
CHLORIDE: 106 mmol/L (ref 101–111)
CO2: 22 mmol/L (ref 22–32)
CREATININE: 0.56 mg/dL (ref 0.44–1.00)
Calcium: 8.1 mg/dL — ABNORMAL LOW (ref 8.9–10.3)
GFR calc Af Amer: >60 mL/min (ref >60)
GFR calc non Af Amer: >60 mL/min (ref >60)
GLUCOSE: 112 mg/dL — AB (ref 65–99)
Potassium: 3.6 mmol/L (ref 3.5–5.1)
SODIUM: 135 mmol/L (ref 135–145)
Total Bilirubin: 0.5 mg/dL (ref 0.3–1.2)
Total Protein: 6.6 g/dL (ref 6.5–8.1)

## 2016-04-13 MED ORDER — PROMETHAZINE HCL 25 MG/ML IJ SOLN
25.0000 mg | Freq: Once | INTRAMUSCULAR | Status: AC
  Administered 2016-04-13: 11:00:00 25 mg via INTRAVENOUS
  Filled 2016-04-13: qty 1

## 2016-04-13 MED ORDER — METOCLOPRAMIDE HCL 5 MG/ML IJ SOLN
5.0000 mg | Freq: Once | INTRAMUSCULAR | Status: AC
  Administered 2016-04-13: 09:00:00 5 mg via INTRAVENOUS
  Filled 2016-04-13: qty 2

## 2016-04-13 MED ORDER — ENOXAPARIN SODIUM 40 MG/0.4ML ~~LOC~~ SOLN
40.0000 mg | SUBCUTANEOUS | Status: DC
Start: 2016-04-13 — End: 2016-04-14
  Administered 2016-04-13: 40 mg via SUBCUTANEOUS
  Filled 2016-04-13: qty 0.4

## 2016-04-13 MED ORDER — NYSTATIN 100000 UNIT/ML MT SUSP
5.0000 mL | Freq: Four times a day (QID) | OROMUCOSAL | Status: DC
  Administered 2016-04-13 – 2016-04-14 (×6): 500000 [IU] via ORAL
  Filled 2016-04-13 (×6): qty 5

## 2016-04-13 NOTE — Progress Notes (Signed)
Patient complaining of severe sore throat. RN looked in patient throat white patches can seen. MD notified. Orders received.

## 2016-04-13 NOTE — Progress Notes (Addendum)
[facility] [place] at [facility]   PATIENT NAME: Dawn Simon    MR#:  [id number]  DATE OF BIRTH:  November 10, 1990  SUBJECTIVE:  Came in withback paina and intractable vomiting  REVIEW OF SYSTEMS:   Review of Systems  Constitutional: Negative for chills, fever and weight loss.  HENT: Negative for ear discharge, ear pain and nosebleeds.   Eyes: Negative for blurred vision, pain and discharge.  Respiratory: Negative for sputum production, shortness of breath, wheezing and stridor.   Cardiovascular: Negative for chest pain, palpitations, orthopnea and PND.  Gastrointestinal: Positive for abdominal pain, nausea and vomiting. Negative for diarrhea.  Genitourinary: Negative for frequency and urgency.  Musculoskeletal: Negative for back pain and joint pain.  Neurological: Positive for weakness. Negative for sensory change, speech change and focal weakness.  Psychiatric/Behavioral: Negative for depression and hallucinations. The patient is not nervous/anxious.    Tolerating Diet:CLD Tolerating PT: ambulatory  DRUG ALLERGIES:  No Known Allergies  VITALS:  Blood pressure (!) 93/37, pulse 87, temperature 98.1 F (36.7 C), temperature source Oral, resp. rate 16, height 5\' 2"  (1.575 m), weight 74.5 kg (164 lb 3.2 oz), SpO2 100 %, currently breastfeeding.  PHYSICAL EXAMINATION:   Physical Exam  GENERAL:  25 y.o.-year-old patient lying in the bed with no acute distress.  EYES: Pupils equal, round, reactive to light and accommodation. No scleral icterus. Extraocular muscles intact.  HEENT: Head atraumatic, normocephalic. Oropharynx and nasopharynx clear.  NECK:  Supple, no jugular venous distention. No thyroid enlargement, no tenderness.  LUNGS: Normal breath sounds bilaterally, no wheezing, rales, rhonchi. No use of accessory muscles of respiration.  CARDIOVASCULAR: S1, S2 normal. No murmurs, rubs, or gallops.  ABDOMEN: Soft, nontender, nondistended. Bowel sounds  present. No organomegaly or mass.  EXTREMITIES: No cyanosis, clubbing or edema b/l.    NEUROLOGIC: Cranial nerves II through XII are intact. No focal Motor or sensory deficits b/l.   PSYCHIATRIC:  patient is alert and oriented x 3.  SKIN: No obvious rash, lesion, or ulcer.   LABORATORY PANEL:  CBC  Recent Labs Lab 04/13/16 0506  WBC 13.5*  HGB 12.2  HCT 37.2  PLT 194    Chemistries   Recent Labs Lab 04/13/16 0506  NA 135  K 3.6  CL 106  CO2 22  GLUCOSE 112*  BUN <5*  CREATININE 0.56  CALCIUM 8.1*  AST 23  ALT 68*  ALKPHOS 102  BILITOT 0.5   Cardiac Enzymes No results for input(s): TROPONINI in the last 168 hours. RADIOLOGY:  US Renal  Result Date: 04/12/2016 CLINICAL DATA:  History of recurrent urinary tract infections, left flank pain, urinary frequency for 2 days. EXAM: RENAL / URINARY TRACT ULTRASOUND COMPLETE COMPARISON:  None. FINDINGS: Right Kidney: Length: 10 cm. Echogenicity within normal limits. No mass or hydronephrosis visualized. No perinephric fluid. Left Kidney: Length: 10.2 cm. Echogenicity within normal limits. No mass or hydronephrosis visualized. No perinephric fluid. Bladder: Appears normal for degree of bladder distention. IMPRESSION: Normal renal ultrasound. Electronically Signed   By: Bary Richard M.D.   On: 04/12/2016 11:16   ASSESSMENT AND PLAN:  Dawn Simon is a 25 y.o. female has a past medical history significant for recurrent UTI's now with fever, nausea, and severe left flank pain. In ER, WBC elevated and pt was febrile. UA grossly abnormal  1.Acute pyelonephritis -Patient presented with severe back pain nausea vomiting elevated white count and abnormal urine and high-grade fever -received IV fluids -When necessary antiemetics and IV Rocephin--  when necessary pain meds  -BC negative -UC multiple species  2. Intractable nausea vomiting due to 1 -resolved -Patient nothing by mouth at present she is not able to tolerate anything   -Continue when necessary antibiotics alternate with Zofran and Phenergan   3.DVT prophylaxis lovenox    Case discussed with Care Management/Social Worker. Management plans discussed with the patient, family and they are in agreement.  CODE STATUS:full  DVT Prophylaxis:lovenox  TOTAL TIME TAKING CARE OF THIS PATIENT: 40 minutes.  >50% time spent on counselling and coordination of care pt and RN   D/C today DEPENDING ON CLINICAL CONDITION.  Note: This dictation was prepared with Dragon dictation along with smaller phrase technology. Any transcriptional errors that result from this process are unintentional.  Amayrany Cafaro M.D on 04/14/2016 at 11:06 AM  Between 7am to 6pm - Pager - [phone]  After 6pm go to [email or web address] - password [email or web address] [facility]  [facility]  Office  [phone]  CC: Primary care physician; No primary care provider on file.

## 2016-04-14 LAB — URINE CULTURE

## 2016-04-14 MED ORDER — CEPHALEXIN 500 MG PO CAPS: 500.0000 mg | ORAL_CAPSULE | Freq: Three times a day (TID) | ORAL | 0 refills | Status: DC

## 2016-04-14 MED ORDER — CEPHALEXIN 500 MG PO CAPS
500.0000 mg | ORAL_CAPSULE | Freq: Two times a day (BID) | ORAL | Status: DC
  Filled 2016-04-14: qty 1

## 2016-04-14 NOTE — Discharge Instructions (Signed)
Plenty of fluids

## 2016-04-14 NOTE — Progress Notes (Signed)
[facility]    Dawn OverallSilvia Simon was admitted to the Hospital on 04/12/2016 and Discharged  04/14/2016 and should be excused from work/school   for *8 days starting 04/12/2016 , may return to work/school without any restrictions. Start work from October 9th  Call Dawn FinnerSona Katasha Riga MD, [facility]  [phone] with questions.  Dawn Simon M.D on 04/14/2016,at 2:04 PM

## 2016-04-14 NOTE — Discharge Summary (Signed)
[facility] - [place] at [facility]   PATIENT NAME: Dawn Simon    MR#:  [id number]  DATE OF BIRTH:  09-27-1990  DATE OF ADMISSION:  04/12/2016 ADMITTING PHYSICIAN: Marguarite Arbour, MD  DATE OF DISCHARGE: 04/14/16  PRIMARY CARE PHYSICIAN: No primary care provider on file.    ADMISSION DIAGNOSIS:  Pyelonephritis [N12] History of recurrent UTIs [Z87.440] Sepsis, unspecified organism (HCC) [A41.9]  DISCHARGE DIAGNOSIS:  Acute Pyelonephritis Sepsis-resolved  SECONDARY DIAGNOSIS:   Past Medical History:  Diagnosis Date  . Kidney infection    2 yrs ago  . Normal pregnancy     HOSPITAL COURSE:  Dawn Simon a 25 y.o.femalehas a past medical history significant for recurrent UTI's now with fever, nausea, and severe left flank pain. In ER, WBC elevated and pt was febrile. UA grossly abnormal  1.Acute pyelonephritis -Patient presented with severe back pain nausea vomiting elevated white count and abnormal urine and high-grade fever -received IV fluids -When necessary antiemetics and IV Rocephin--po keflex - when necessary pain meds  -BC negative -UC multiple species  2. Intractable nausea vomiting due to 1 -resolved -Patient nothing by mouth at present she is not able to tolerate anything  -Continue when necessary antibiotics alternate with Zofran and Phenergan   3.DVT prophylaxis lovenox  Overall better.  Soft diet at lunch D/c home later CONSULTS OBTAINED:    DRUG ALLERGIES:  No Known Allergies  DISCHARGE MEDICATIONS:   Current Discharge Medication List    START taking these medications   Details  cephALEXin (KEFLEX) 500 MG capsule Take 1 capsule (500 mg total) by mouth 3 (three) times daily. Qty: 21 capsule, Refills: 0        If you experience worsening of your admission symptoms, develop shortness of breath, life threatening emergency, suicidal or homicidal thoughts you must seek medical attention immediately by  calling 911 or calling your MD immediately  if symptoms less severe.  You Must read complete instructions/literature along with all the possible adverse reactions/side effects for all the Medicines you take and that have been prescribed to you. Take any new Medicines after you have completely understood and accept all the possible adverse reactions/side effects.   Please note  You were cared for by a hospitalist during your hospital stay. If you have any questions about your discharge medications or the care you received while you were in the hospital after you are discharged, you can call the unit and asked to speak with the hospitalist on call if the hospitalist that took care of you is not available. Once you are discharged, your primary care physician will handle any further medical issues. Please note that NO REFILLS for any discharge medications will be authorized once you are discharged, as it is imperative that you return to your primary care physician (or establish a relationship with a primary care physician if you do not have one) for your aftercare needs so that they can reassess your need for medications and monitor your lab values. Today   SUBJECTIVE    Doing well VITAL SIGNS:  Blood pressure (!) 93/37, pulse 87, temperature 98.1 F (36.7 C), temperature source Oral, resp. rate 16, height 5\' 2"  (1.575 m), weight 74.5 kg (164 lb 3.2 oz), SpO2 100 %, currently breastfeeding.  I/O:   Intake/Output Summary (Last 24 hours) at 04/14/16 1108 Last data filed at 04/14/16 0800  Gross per 24 hour  Intake             2615  ml  Output              600 ml  Net             2015 ml    PHYSICAL EXAMINATION:  GENERAL:  25 y.o.-year-old patient lying in the bed with no acute distress.  EYES: Pupils equal, round, reactive to light and accommodation. No scleral icterus. Extraocular muscles intact.  HEENT: Head atraumatic, normocephalic. Oropharynx and nasopharynx clear.  NECK:  Supple, no  jugular venous distention. No thyroid enlargement, no tenderness.  LUNGS: Normal breath sounds bilaterally, no wheezing, rales,rhonchi or crepitation. No use of accessory muscles of respiration.  CARDIOVASCULAR: S1, S2 normal. No murmurs, rubs, or gallops.  ABDOMEN: Soft, non-tender, non-distended. Bowel sounds present. No organomegaly or mass.  EXTREMITIES: No pedal edema, cyanosis, or clubbing.  NEUROLOGIC: Cranial nerves II through XII are intact. Muscle strength 5/5 in all extremities. Sensation intact. Gait not checked.  PSYCHIATRIC: The patient is alert and oriented x 3.  SKIN: No obvious rash, lesion, or ulcer.   DATA REVIEW:   CBC   Recent Labs Lab 04/13/16 0506  WBC 13.5*  HGB 12.2  HCT 37.2  PLT 194    Chemistries   Recent Labs Lab 04/13/16 0506  NA 135  K 3.6  CL 106  CO2 22  GLUCOSE 112*  BUN <5*  CREATININE 0.56  CALCIUM 8.1*  AST 23  ALT 68*  ALKPHOS 102  BILITOT 0.5    Microbiology Results   Recent Results (from the past 240 hour(s))  Urine culture     Status: Abnormal   Collection Time: 04/12/16  7:58 AM  Result Value Ref Range Status   Specimen Description URINE, RANDOM  Final   Special Requests NONE  Final   Culture MULTIPLE SPECIES PRESENT, SUGGEST RECOLLECTION (A)  Final   Report Status 04/14/2016 FINAL  Final  Culture, blood (routine x 2)     Status: None (Preliminary result)   Collection Time: 04/12/16  9:44 AM  Result Value Ref Range Status   Specimen Description BLOOD LEFT AC  Final   Special Requests BOTTLES DRAWN AEROBIC AND ANAEROBIC 7CC  Final   Culture NO GROWTH 2 DAYS  Final   Report Status PENDING  Incomplete  Culture, blood (routine x 2)     Status: None (Preliminary result)   Collection Time: 04/12/16  9:49 AM  Result Value Ref Range Status   Specimen Description BLOOD RIGHT AC  Final   Special Requests BOTTLES DRAWN AEROBIC AND ANAEROBIC 7CC  Final   Culture NO GROWTH 2 DAYS  Final   Report Status PENDING  Incomplete   Culture, group A strep     Status: None (Preliminary result)   Collection Time: 04/13/16  5:58 AM  Result Value Ref Range Status   Specimen Description THROAT  Final   Special Requests NONE  Final   Culture   Final    CULTURE REINCUBATED FOR BETTER GROWTH [facility]    Report Status PENDING  Incomplete    RADIOLOGY:  [place] Renal  Result Date: 04/12/2016 CLINICAL DATA:  History of recurrent urinary tract infections, left flank pain, urinary frequency for 2 days. EXAM: RENAL / URINARY TRACT ULTRASOUND COMPLETE COMPARISON:  None. FINDINGS: Right Kidney: Length: 10 cm. Echogenicity within normal limits. No mass or hydronephrosis visualized. No perinephric fluid. Left Kidney: Length: 10.2 cm. Echogenicity within normal limits. No mass or hydronephrosis visualized. No perinephric fluid. Bladder: Appears normal for degree of  bladder distention. IMPRESSION: Normal renal ultrasound. Electronically Signed   By: Bary Richard M.D.   On: 04/12/2016 11:16     Management plans discussed with the patient, family and they are in agreement.  CODE STATUS:     Code Status Orders        Start     Ordered   04/12/16 1152  Full code  Continuous     04/12/16 1151    Code Status History    Date Active Date Inactive Code Status Order ID Comments User Context   11/07/2015 12:26 AM 11/08/2015  5:04 PM Full Code [id number]  Farrel Conners, CNM Inpatient   11/06/2015  4:43 AM 11/07/2015 12:26 AM Full Code [id number]  Vena Austria, MD Inpatient   11/05/2015  7:54 PM 11/06/2015 12:58 AM Full Code [id number]  Vena Austria, MD Inpatient      TOTAL TIME TAKING CARE OF THIS PATIENT: 40 minutes.    Sawyer Mentzer M.D on 04/14/2016 at 11:08 AM  Between 7am to 6pm - Pager - [phone] After 6pm go to [email or web address] - password [email or web address] [facility]  [place] [facility]  Office  [phone]  CC: Primary care physician; No primary care provider on file.

## 2016-04-14 NOTE — Progress Notes (Signed)
Patient discharged home per MD order. All discharge instructions given and all questions answered. Work note given to patient. 

## 2016-04-15 LAB — CULTURE, GROUP A STREP (THRC)

## 2016-04-17 LAB — CULTURE, BLOOD (ROUTINE X 2)
CULTURE: NO GROWTH
Culture: NO GROWTH

## 2018-07-03 IMAGING — US US RENAL
1 series · 14 of 25 positions shown · non-contrast
Comparison: None.

CLINICAL DATA: History of recurrent urinary tract infections, left
flank pain, urinary frequency for 2 days.

EXAM:
RENAL / URINARY TRACT ULTRASOUND COMPLETE

[Series 1: us renal · 0.25mm/px · 14 of 45 slices shown]
[im 1/45]
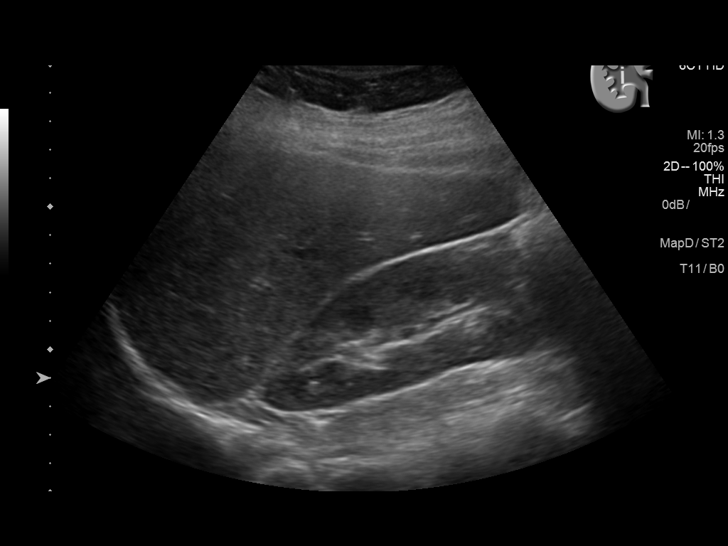
[im 4/45]
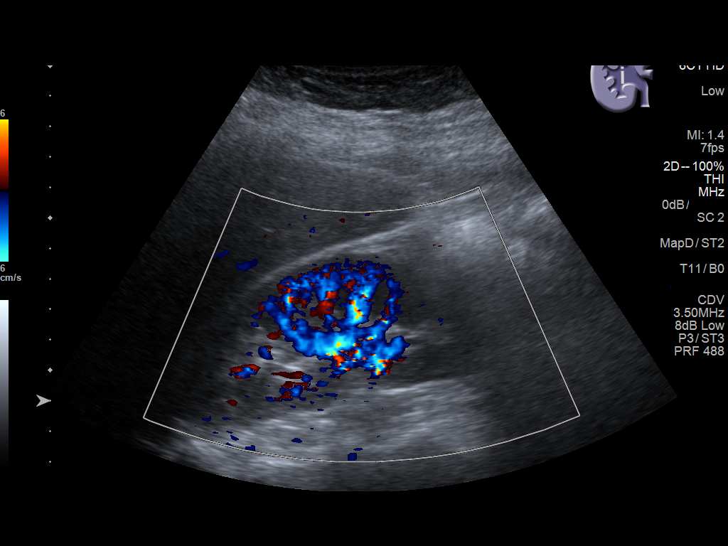
[im 8/45]
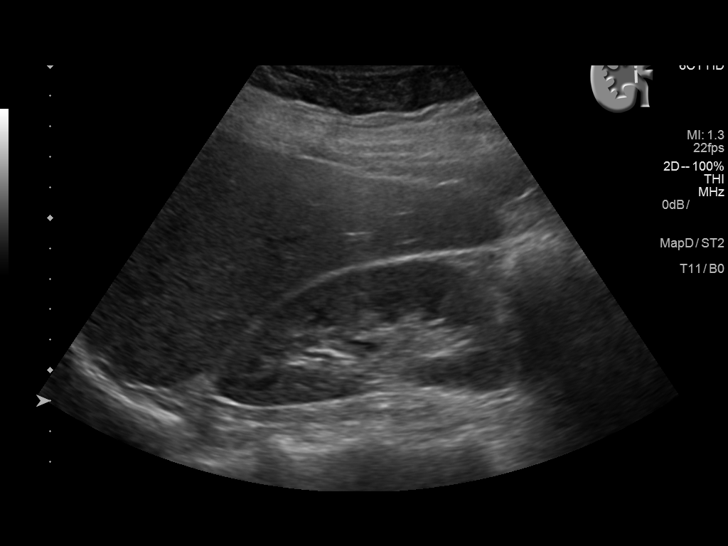
[im 12/45]
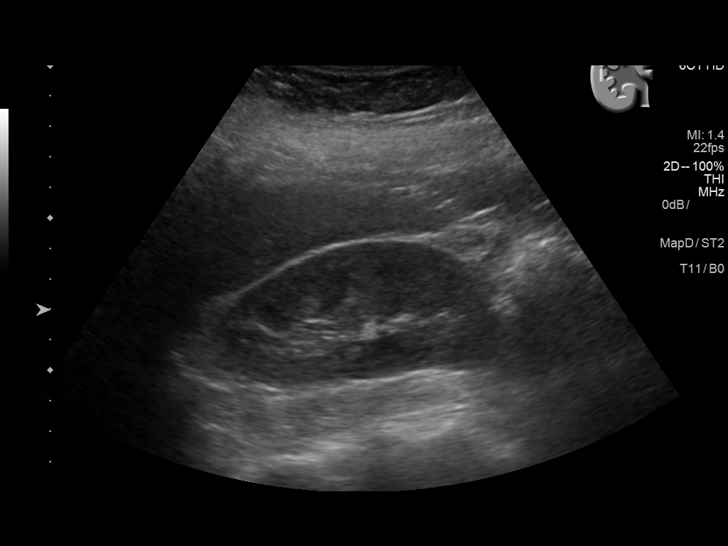
[im 15/45]
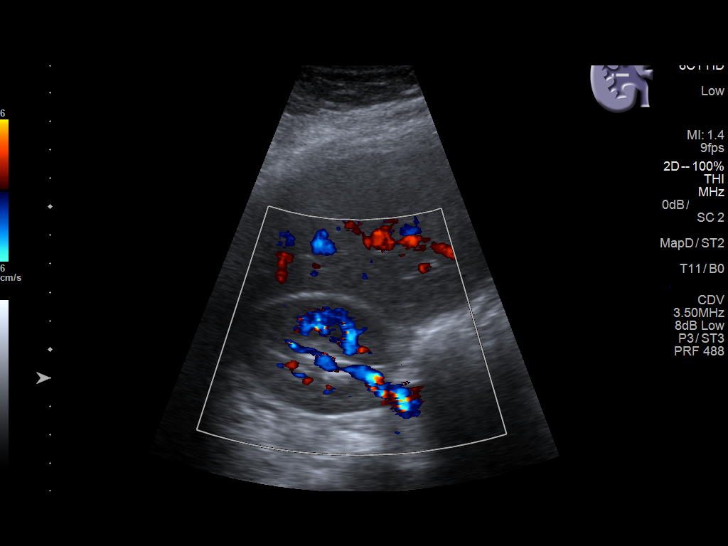
[im 17/45]
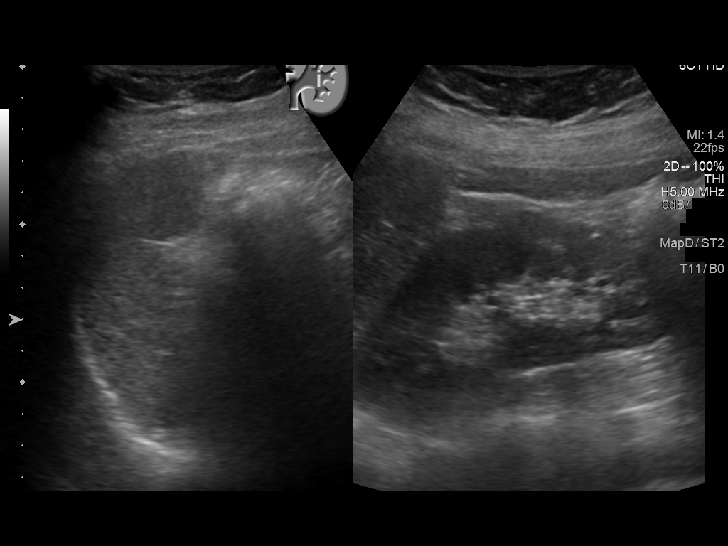
[im 21/45]
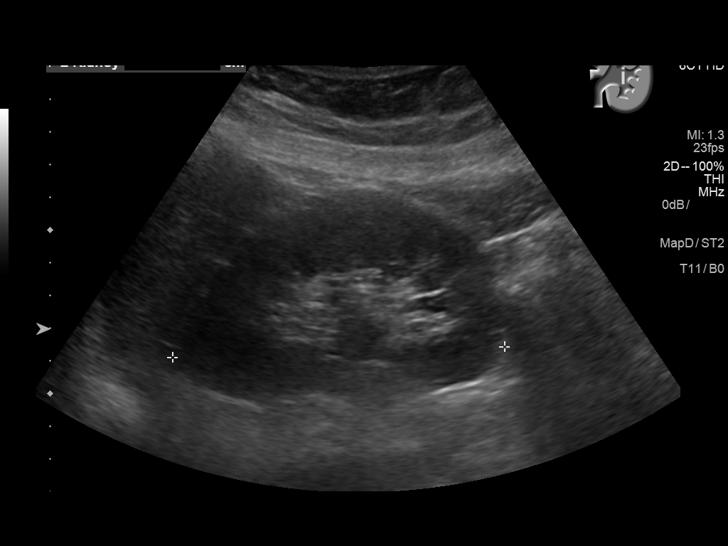
[im 24/45]
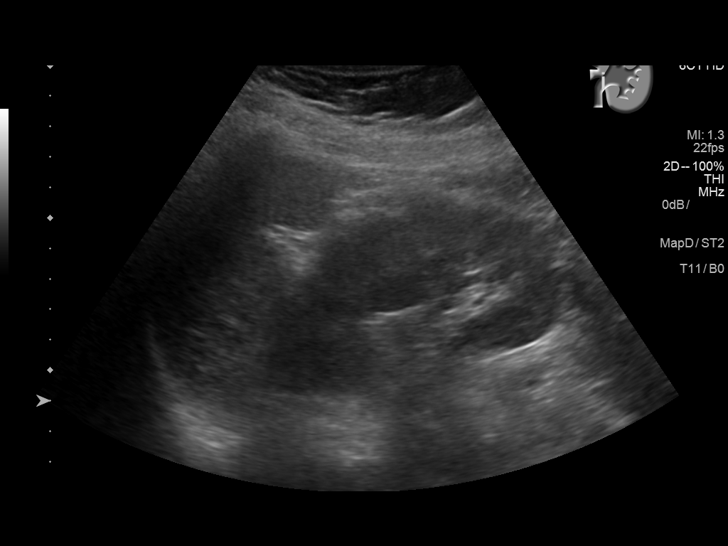
[im 28/45]
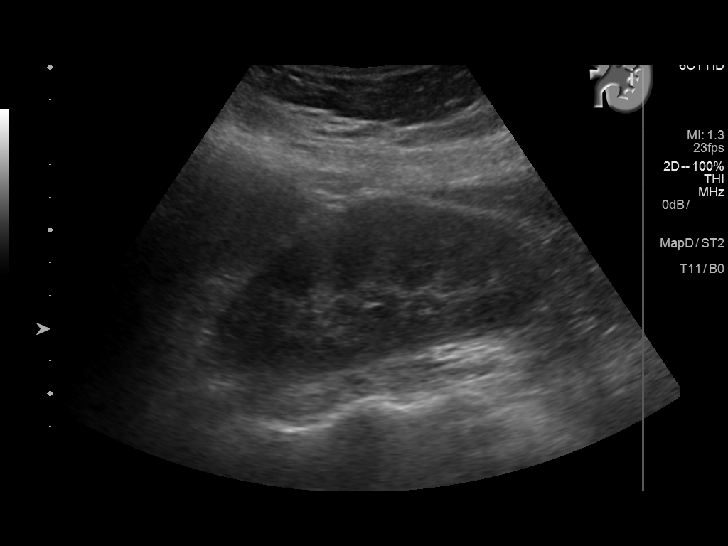
[im 30/45]
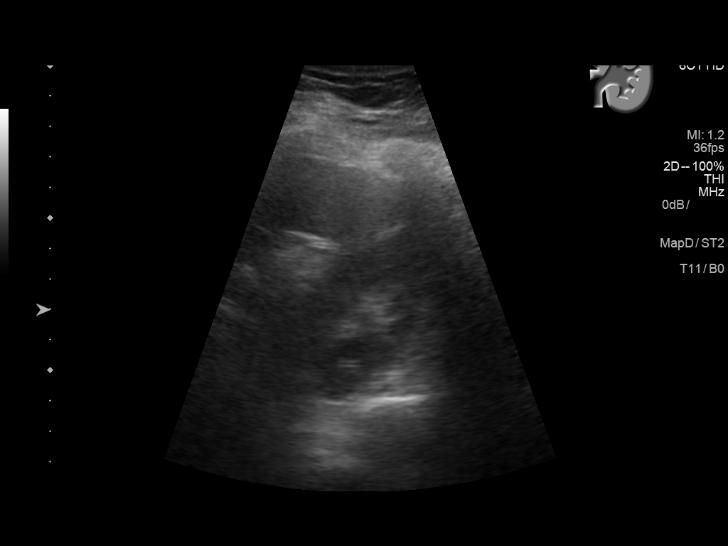
[im 34/45]
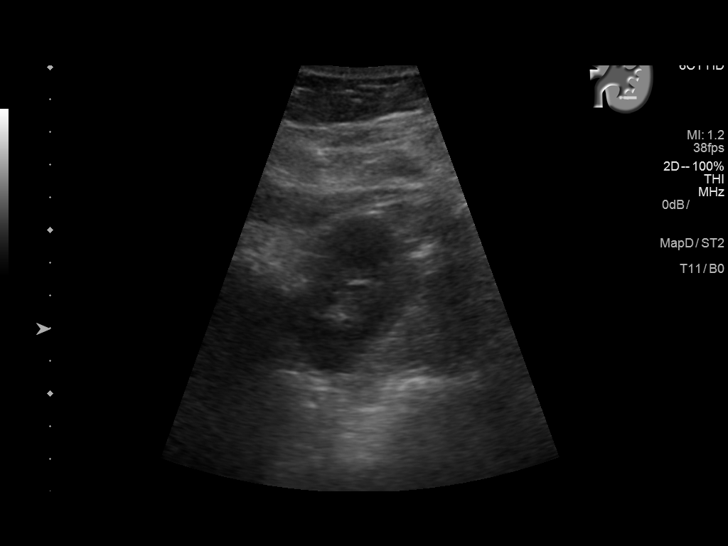
[im 37/45]
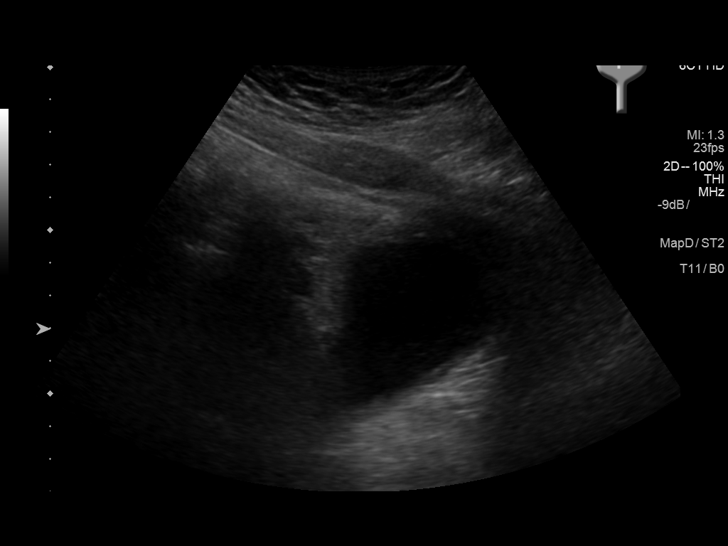
[im 41/45]
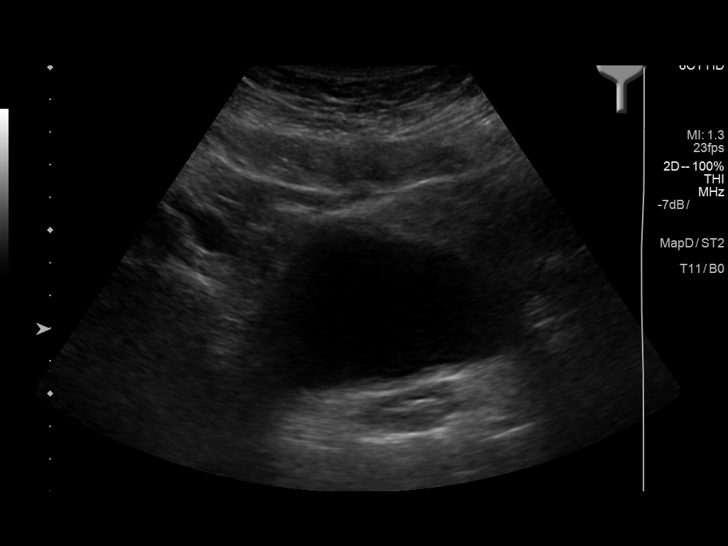
[im 45/45]
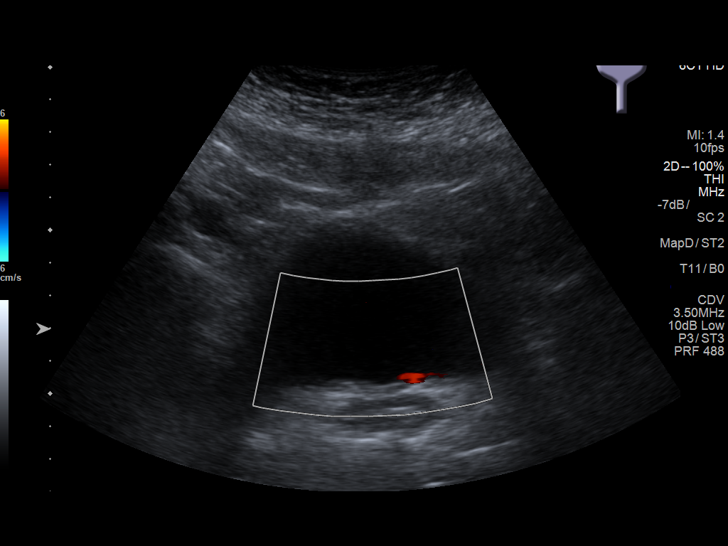

[14 of 25 positions shown; findings below may reference images not displayed]

FINDINGS: Right Kidney:

Length: 10 cm. Echogenicity within normal limits. No mass or
hydronephrosis visualized. No perinephric fluid.

Left Kidney:

Length: 10.2 cm. Echogenicity within normal limits. No mass or
hydronephrosis visualized. No perinephric fluid.

Bladder:

Appears normal for degree of bladder distention.
IMPRESSION: Normal renal ultrasound.

## 2018-12-26 ENCOUNTER — Encounter: Payer: Self-pay | Admitting: Obstetrics and Gynecology

## 2018-12-26 ENCOUNTER — Other Ambulatory Visit (HOSPITAL_COMMUNITY)
Admission: RE | Admit: 2018-12-26 | Discharge: 2018-12-26 | Disposition: A | Discharge status: Home / Self Care | Payer: Medicaid Other | Source: Ambulatory Visit | Attending: Obstetrics and Gynecology | Admitting: Obstetrics and Gynecology

## 2018-12-26 ENCOUNTER — Ambulatory Visit (INDEPENDENT_AMBULATORY_CARE_PROVIDER_SITE_OTHER): Payer: Medicaid Other | Admitting: Obstetrics and Gynecology

## 2018-12-26 ENCOUNTER — Other Ambulatory Visit: Payer: Self-pay

## 2018-12-26 VITALS — BP 90/70 | Ht 62.0 in | Wt 155.4 lb

## 2018-12-26 DIAGNOSIS — R87615 Unsatisfactory cytologic smear of cervix: Secondary | ICD-10-CM

## 2018-12-26 DIAGNOSIS — Z124 Encounter for screening for malignant neoplasm of cervix: Secondary | ICD-10-CM | POA: Insufficient documentation

## 2018-12-26 DIAGNOSIS — Z3049 Encounter for surveillance of other contraceptives: Secondary | ICD-10-CM | POA: Diagnosis not present

## 2018-12-26 DIAGNOSIS — N888 Other specified noninflammatory disorders of cervix uteri: Secondary | ICD-10-CM

## 2018-12-26 DIAGNOSIS — Z3046 Encounter for surveillance of implantable subdermal contraceptive: Secondary | ICD-10-CM

## 2018-12-26 MED ORDER — DOXYCYCLINE HYCLATE 100 MG PO CAPS: 100.0000 mg | ORAL_CAPSULE | Freq: Two times a day (BID) | ORAL | 0 refills | Status: DC

## 2018-12-26 NOTE — Progress Notes (Signed)
Chief Complaint  Patient presents with  . Contraception    nexplanon removal, not interested in [facility]  . Abnormal Pap Smear    repeat today     HPI:      Ms. Dawn Simon is a 28 y.o. G1P1001 who LMP was No LMP recorded. Patient has had an implant., presents today for nexplanon removal as well as repeat pap smear. Nexplanon placed 3 yrs ago. Pt is amenorrheic, no cramping. She is not currently sex active. Declines BC for now.   She also had pap smear about 4 months ago at [facility] and was told she needed a repeat in 4 months. She was bleeding at that time. Had neg STD testing then. Wants repeat done today. No hx of postcoital bleeding.   Past Medical History:  Diagnosis Date  . Kidney infection    2 yrs ago  . Normal pregnancy     Past Surgical History:  Procedure Laterality Date  . NO PAST SURGERIES      Family History  Problem Relation Age of Onset  . Hypertension Maternal Grandmother   . Diabetes Paternal Grandfather     Social History   Socioeconomic History  . Marital status: Single    Spouse name: Not on file  . Number of children: Not on file  . Years of education: Not on file  . Highest education level: Not on file  Occupational History  . Not on file  Social Needs  . Financial resource strain: Not on file  . Food insecurity    Worry: Not on file    Inability: Not on file  . Transportation needs    Medical: Not on file    Non-medical: Not on file  Tobacco Use  . Smoking status: Never Smoker  . Smokeless tobacco: Never Used  Substance and Sexual Activity  . Alcohol use: No  . Drug use: No  . Sexual activity: Not Currently    Birth control/protection: None  Lifestyle  . Physical activity    Days per week: Not on file    Minutes per session: Not on file  . Stress: Not on file  Relationships  . Social Herbalist on phone: Not on file    Gets together: Not on file    Attends religious service: Not on file    Active member of club or  organization: Not on file    Attends meetings of clubs or organizations: Not on file    Relationship status: Not on file  . Intimate partner violence    Fear of current or ex partner: Not on file    Emotionally abused: Not on file    Physically abused: Not on file    Forced sexual activity: Not on file  Other Topics Concern  . Not on file  Social History Narrative  . Not on file    Outpatient Medications Prior to Visit  Medication Sig Dispense Refill  . cephALEXin (KEFLEX) 500 MG capsule Take 1 capsule (500 mg total) by mouth 3 (three) times daily. 21 capsule 0   No facility-administered medications prior to visit.       ROS:  Review of Systems  Constitutional: Negative for fever.  Gastrointestinal: Negative for blood in stool, constipation, diarrhea, nausea and vomiting.  Genitourinary: Negative for dyspareunia, dysuria, flank pain, frequency, hematuria, urgency, vaginal bleeding, vaginal discharge and vaginal pain.  Musculoskeletal: Negative for back pain.  Skin: Negative for rash.    OBJECTIVE:   Vitals:  BP 90/70   Ht 5\' 2"  (1.575 m)   Wt 155 lb 6.4 oz (70.5 kg)   Breastfeeding No   BMI 28.42 kg/m   Physical Exam Vitals signs reviewed.  Constitutional:      Appearance: She is well-developed.  Neck:     Musculoskeletal: Normal range of motion.  Pulmonary:     Effort: Pulmonary effort is normal.  Genitourinary:    General: Normal vulva.     Pubic Area: No rash.      Labia:        Right: No rash, tenderness or lesion.        Left: No rash, tenderness or lesion.      Vagina: Normal. No vaginal discharge, erythema or tenderness.     Cervix: Friability present.     Uterus: Normal. Not enlarged and not tender.      Adnexa: Right adnexa normal and left adnexa normal.       Right: No mass or tenderness.         Left: No mass or tenderness.       Comments: FRIABLE CX WITH CONTACT BLEEDING Musculoskeletal: Normal range of motion.  Skin:    General: Skin is  warm and dry.  Neurological:     General: No focal deficit present.     Mental Status: She is alert and oriented to person, place, and time.  Psychiatric:        Mood and Affect: Mood normal.        Behavior: Behavior normal.        Thought Content: Thought content normal.        Judgment: Judgment normal.     Nexplanon removal Procedure note - The Nexplanon was noted in the patient's arm and the end was identified. The skin was cleansed with a Betadine solution. A small injection of subcutaneous lidocaine with epinephrine was given over the end of the implant. An incision was made at the end of the implant. The rod was noted in the incision and grasped with a hemostat. It was noted to be intact.  Steri-Strip was placed approximating the incision. Hemostasis was noted.   Assessment/Plan: Nexplanon removal - Plan: Pt tolerated well. She was told to remove the dressing in 12-24 hours, to keep the incision area dry for 24 hours and to remove the Steristrip in 2-3  days.  Notify us if any signs of tenderness, redness, pain, or fevers develop.   Cervical cancer screening - Plan: Cytology - PAP, Repeat pap today. Will call with results.   Unsatisfactory cervical Papanicolaou smear - Plan: Cytology - PAP, Get pap records from [facility].  Friable cervix - Plan: doxycycline (VIBRAMYCIN) 100 MG capsule, On exam. Neg STD testing at [facility]. Rx doxy. F/u prn.     Meds ordered this encounter  Medications  . doxycycline (VIBRAMYCIN) 100 MG capsule    Sig: Take 1 capsule (100 mg total) by mouth 2 (two) times daily.    Dispense:  14 capsule    Refill:  0    Order Specific Question:   Supervising Provider    Answer:   Nadara MustardHARRIS, ROBERT P [[id number]]      Return if symptoms worsen or fail to improve.  Kajuana Shareef B. Arvin Abello, PA-C 12/26/2018 4:48 PM

## 2018-12-26 NOTE — Patient Instructions (Signed)
I value your feedback and entrusting us with your care. If you get a [facility] patient survey, I would appreciate you taking the time to let us know about your experience today. Thank you!  Remove the dressing in 24 hours,  keep the incision area dry for 24 hours and remove the Steristrip in 2-3  days.  Notify us if any signs of tenderness, redness, pain, or fevers develop.  

## 2018-12-29 ENCOUNTER — Telehealth: Payer: Self-pay | Admitting: Obstetrics and Gynecology

## 2018-12-29 LAB — CYTOLOGY - PAP

## 2018-12-29 NOTE — Telephone Encounter (Signed)
[facility] re: pap results.

## 2018-12-29 NOTE — Telephone Encounter (Signed)
Patient is returning missed call. Patient aware [facility] not in office until Monday 01/02/19

## 2019-01-02 NOTE — Telephone Encounter (Signed)
Pt aware of pap results and need for colpo/bx.

## 2019-01-23 ENCOUNTER — Encounter: Payer: Self-pay | Admitting: Obstetrics and Gynecology

## 2019-01-23 ENCOUNTER — Other Ambulatory Visit (HOSPITAL_COMMUNITY)
Admission: RE | Admit: 2019-01-23 | Discharge: 2019-01-23 | Disposition: A | Discharge status: Home / Self Care | Payer: Medicaid Other | Source: Ambulatory Visit | Attending: Obstetrics and Gynecology | Admitting: Obstetrics and Gynecology

## 2019-01-23 ENCOUNTER — Other Ambulatory Visit: Payer: Self-pay

## 2019-01-23 ENCOUNTER — Ambulatory Visit (INDEPENDENT_AMBULATORY_CARE_PROVIDER_SITE_OTHER): Payer: Medicaid Other | Admitting: Obstetrics and Gynecology

## 2019-01-23 VITALS — BP 124/78 | Ht 62.0 in | Wt 157.0 lb

## 2019-01-23 DIAGNOSIS — R87619 Unspecified abnormal cytological findings in specimens from cervix uteri: Secondary | ICD-10-CM | POA: Diagnosis present

## 2019-01-23 DIAGNOSIS — N841 Polyp of cervix uteri: Secondary | ICD-10-CM

## 2019-01-23 DIAGNOSIS — N870 Mild cervical dysplasia: Secondary | ICD-10-CM

## 2019-01-23 NOTE — Progress Notes (Signed)
HPI:  Dawn Simon is a 28 y.o.  G1P1001  who presents today for evaluation and management of abnormal cervical cytology.    Dysplasia History:  Atypical endocervical glandular cells She recently had a Nexplanon removed that had been in place for 3 years. She had no menses while she had the Nexplanon.  Prior to the nexplanon, she had infrequent menses.  She is having her first menstrual bleeding today since the removal of the Nexplanon.   OB History  Gravida Para Term Preterm AB Living  1 1 1     1   SAB TAB Ectopic Multiple Live Births        0 1    # Outcome Date GA Lbr Len/2nd Weight Sex Delivery Anes PTL Lv  1 Term 11/06/15 [redacted]w[redacted]d 21:26 / 02:20 7 lb 10.1 oz (3.46 kg) M Vag-Spont EPI  LIV    Past Medical History:  Diagnosis Date  . Kidney infection    2 yrs ago  . Normal pregnancy     Past Surgical History:  Procedure Laterality Date  . NO PAST SURGERIES      SOCIAL HISTORY:  Social History   Substance and Sexual Activity  Alcohol Use No   Substance and Sexual Activity  Alcohol Use No     Substance and Sexual Activity  Drug Use No     Family History  Problem Relation Age of Onset  . Hypertension Maternal Grandmother   . Diabetes Paternal Grandfather     ALLERGIES:  Patient has no known allergies.  Current Outpatient Medications on File Prior to Visit  Medication Sig Dispense Refill  . doxycycline (VIBRAMYCIN) 100 MG capsule Take 1 capsule (100 mg total) by mouth 2 (two) times daily. 14 capsule 0   No current facility-administered medications on file prior to visit.     Physical Exam: -Vitals:  BP 124/78   Ht 5\' 2"  (1.575 m)   Wt 157 lb (71.2 kg)   LMP 01/20/2019   BMI 28.72 kg/m  GEN: WD, WN, NAD.  A+ O x 3, good mood and affect. ABD:  NT, ND.  Soft, no masses.  No hernias noted.   Pelvic:   Vulva: Normal appearance.  No lesions.  Vagina: No lesions or abnormalities noted.  Support: Normal pelvic support.  Urethra No masses tenderness  or scarring.  Meatus Normal size without lesions or prolapse.  Cervix: See below.  Anus: Normal exam.  No lesions.  Perineum: Normal exam.  No lesions.        Bimanual   Uterus: Normal size.  Non-tender.  Mobile.  AV.  Adnexae: No masses.  Non-tender to palpation.  Cul-de-sac: Negative for abnormality.  Physical Exam Exam conducted with a chaperone present.  Genitourinary:    General: Normal vulva.     Exam position: Lithotomy position.     Tanner stage (genital): 5.     Vagina: Normal.         PROCEDURE: 1.  Urine Pregnancy Test:  negative 2.  Colposcopy performed with 4% acetic acid after verbal consent obtained                                         -Aceto-white Lesions Location(s): mildly diffusely               -Biopsy performed at 4, 8, and 12 o'clock               -  ECC indicated and performed: Yes.     - The endocervical polypoid lesion had a base that was fairly proximal.  It was attempted to be grasped with a ring forcep, a [place] forcep.  Both unsuccessfully.  The biopsy forceps were eventually used to remove portions of the polyp.  The base of the polyp (inasmuch as I could see and access the base was made hemostatic with silver nitrate and monsel's solution)     -Biopsy sites made hemostatic with pressure, AgNO3, and/or Monsel's solution   -Satisfactory colposcopy: No.    -Evidence of Invasive cervical CA :  NO  Female chaperone present for pelvic exam:   ASSESSMENT:  Dawn Simon is a 28 y.o. G1P1001 here for  1. 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 Cervical polyp   .  PLAN: 1.  I discussed the grading system of pap smears and HPV high risk viral types.  We will discuss and base management after colpo results return. 2. Discussed all findings. Given that she has been on progesterone for nearly three straight years along with her age, she would be at low risk for endometrial hyperplasia/EIN and cancer of  the endometrium.  We discussed this and did NOT perform an endometrial biopsy, given the above.       Dawn MohairStephen Deeksha Cotrell, MD  [facility], [facility] 01/23/2019  1:01 PM

## 2019-01-25 ENCOUNTER — Telehealth: Payer: Self-pay | Admitting: Obstetrics and Gynecology

## 2019-01-25 ENCOUNTER — Encounter: Payer: Self-pay | Admitting: Obstetrics and Gynecology

## 2019-01-25 NOTE — Telephone Encounter (Signed)
Thank you :)

## 2019-01-25 NOTE — Telephone Encounter (Signed)
Patient is returning missed call for Dr. Glennon Mac. Please advise

## 2019-01-25 NOTE — Telephone Encounter (Signed)
Discussed colposcopy findings of CIN-1.  She also had a negative endocervical curettage.  The polypoid lesion returned as a benign endocervical polyp.  We did discuss that it would be impossible to know whether the polyp was completely gotten rid of based on its location.  She was advised to let me know if she had abnormal bleeding, whether that be heavy or irregular, over the next several months or in the near future.  The recommended follow-up would be to have her repeat a Pap smear in 1 year.  She voiced understanding of this recommendation and agreement to follow-up.  I reiterated that only with follow-up could we prevent cancer and that without follow-up I could not guarantee that she did not develop cancer down the road.  She voiced understanding of this idea and again agreement to follow-up in 1 year.  All questions answered.  She has no acute complaints today.

## 2019-01-25 NOTE — Telephone Encounter (Signed)
Left generic VM 

## 2019-08-29 ENCOUNTER — Encounter: Payer: Medicaid Other | Admitting: Obstetrics & Gynecology

## 2020-01-12 ENCOUNTER — Telehealth: Payer: Self-pay

## 2020-01-12 NOTE — Telephone Encounter (Signed)
Telephone call to patient today regarding the need for a Physical and a Repeat PAP (due 12/2019).  Left message to return my call.  [name] interpreted the message today. [name], RN

## 2020-01-18 NOTE — Telephone Encounter (Signed)
Telephone call today to patient regarding the need for a  repeat PAP and physical (due June 2021).   Left  Message to return my call.  Salli Real interpreted the call today.

## 2020-02-01 NOTE — Telephone Encounter (Signed)
Telephone call today to patient regarding the need for a repeat PAP and physical.  [place] but appointment was scheduled for 03/05/2020 at 4pm (arrival time is 3:30pm).  Marigene Ehlers assisted with the call to interpret and to schedule the appointment in Epic. [name], RN

## 2020-03-05 ENCOUNTER — Ambulatory Visit: Payer: Self-pay

## 2020-04-15 ENCOUNTER — Ambulatory Visit: Payer: Medicaid Other | Admitting: Obstetrics and Gynecology

## 2020-04-15 DIAGNOSIS — N870 Mild cervical dysplasia: Secondary | ICD-10-CM | POA: Insufficient documentation

## 2020-04-15 DIAGNOSIS — R87619 Unspecified abnormal cytological findings in specimens from cervix uteri: Secondary | ICD-10-CM | POA: Insufficient documentation

## 2020-04-15 NOTE — Progress Notes (Deleted)
PCP:  Patient, No Pcp Per   No chief complaint on file.    HPI:      Ms. Dawn Simon is a 29 y.o. G1P1001 whose LMP was No LMP recorded. Patient has had an implant., presents today for her annual examination.  Her menses are {norm/abn:715}, lasting {number:22536} days.  Dysmenorrhea {dysmen:716}. She {does:18564} have intermenstrual bleeding.  Sex activity: {sex active:315163}.  Last Pap: 12/26/18  Results were: glandular cell abnormality (AGUS) . Colpo with bx with DR. 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 Repeat pap due today. Also had benign cx polyp removed Hx of STDs: {STD hx:14358}  There is no FH of breast cancer. There is no FH of ovarian cancer. The patient {does:18564} do self-breast exams.  Tobacco use: {tob:20664} Alcohol use: {Alcohol:11675} No drug use.  Exercise: {exercise:31265}  She {does:18564} get adequate calcium and Vitamin D in her diet.   The pregnancy intention screening data noted above was reviewed. Potential methods of contraception were discussed. The patient elected to proceed with {Upstream End Methods:24109}.     Past Medical History:  Diagnosis Date  . Kidney infection    2 yrs ago  . Normal pregnancy     Past Surgical History:  Procedure Laterality Date  . NO PAST SURGERIES      Family History  Problem Relation Age of Onset  . Hypertension Maternal Grandmother   . Diabetes Paternal Grandfather     Social History   Socioeconomic History  . Marital status: Single    Spouse name: Not on file  . Number of children: Not on file  . Years of education: Not on file  . Highest education level: Not on file  Occupational History  . Not on file  Tobacco Use  . Smoking status: Never Smoker  . Smokeless tobacco: Never Used  Vaping Use  . Vaping Use: Never used  Substance and Sexual Activity  . Alcohol use: No  . Drug use: No  . Sexual activity: Not Currently    Birth control/protection: None  Other Topics Concern  . Not on file  Social  History Narrative  . Not on file   Social Determinants of Health   Financial Resource Strain:   . Difficulty of Paying Living Expenses: Not on file  Food Insecurity:   . Worried About Programme researcher, broadcasting/film/video in the Last Year: Not on file  . Ran Out of Food in the Last Year: Not on file  Transportation Needs:   . Lack of Transportation (Medical): Not on file  . Lack of Transportation (Non-Medical): Not on file  Physical Activity:   . Days of Exercise per Week: Not on file  . Minutes of Exercise per Session: Not on file  Stress:   . Feeling of Stress : Not on file  Social Connections:   . Frequency of Communication with Friends and Family: Not on file  . Frequency of Social Gatherings with Friends and Family: Not on file  . Attends Religious Services: Not on file  . Active Member of Clubs or Organizations: Not on file  . Attends Banker Meetings: Not on file  . Marital Status: Not on file  Intimate Partner Violence:   . Fear of Current or Ex-Partner: Not on file  . Emotionally Abused: Not on file  . Physically Abused: Not on file  . Sexually Abused: Not on file     Current Outpatient Medications:  .  doxycycline (VIBRAMYCIN) 100 MG capsule, Take 1 capsule (100 mg  total) by mouth 2 (two) times daily., Disp: 14 capsule, Rfl: 0     ROS:  Review of Systems BREAST: No symptoms   Objective: There were no vitals taken for this visit.   OBGyn Exam  Results: No results found for this or any previous visit (from the past 24 hour(s)).  Assessment/Plan: No diagnosis found.  No orders of the defined types were placed in this encounter.            GYN counsel {counseling:16159}     F/U  No follow-ups on file.  Leslee Suire B. Mykaela Arena, PA-C 04/15/2020 11:54 AM

## 2020-08-21 ENCOUNTER — Encounter: Payer: Self-pay | Admitting: Obstetrics and Gynecology

## 2020-08-21 ENCOUNTER — Other Ambulatory Visit (HOSPITAL_COMMUNITY)
Admission: RE | Admit: 2020-08-21 | Discharge: 2020-08-21 | Disposition: A | Discharge status: Home / Self Care | Payer: Medicaid Other | Source: Ambulatory Visit | Attending: Obstetrics and Gynecology | Admitting: Obstetrics and Gynecology

## 2020-08-21 ENCOUNTER — Ambulatory Visit (INDEPENDENT_AMBULATORY_CARE_PROVIDER_SITE_OTHER): Payer: Medicaid Other | Admitting: Obstetrics and Gynecology

## 2020-08-21 VITALS — BP 116/71 | Wt 152.0 lb

## 2020-08-21 DIAGNOSIS — R87619 Unspecified abnormal cytological findings in specimens from cervix uteri: Secondary | ICD-10-CM | POA: Diagnosis present

## 2020-08-21 DIAGNOSIS — Z0283 Encounter for blood-alcohol and blood-drug test: Secondary | ICD-10-CM

## 2020-08-21 DIAGNOSIS — Z3A10 10 weeks gestation of pregnancy: Secondary | ICD-10-CM | POA: Insufficient documentation

## 2020-08-21 DIAGNOSIS — N926 Irregular menstruation, unspecified: Secondary | ICD-10-CM

## 2020-08-21 DIAGNOSIS — Z113 Encounter for screening for infections with a predominantly sexual mode of transmission: Secondary | ICD-10-CM

## 2020-08-21 DIAGNOSIS — Z369 Encounter for antenatal screening, unspecified: Secondary | ICD-10-CM

## 2020-08-21 DIAGNOSIS — Z3480 Encounter for supervision of other normal pregnancy, unspecified trimester: Secondary | ICD-10-CM | POA: Diagnosis not present

## 2020-08-21 LAB — POCT URINE PREGNANCY: Preg Test, Ur: POSITIVE — AB

## 2020-08-21 NOTE — Progress Notes (Signed)
New Obstetric Patient H&P   Chief Complaint: "Desires prenatal care"  History of Present Illness: Patient is a 30 y.o. G25P1001 Hispanic or Latino female, sure LMP 06/12/2020 presents with amenorrhea and positive home pregnancy test. Based on her  LMP, her EDD is Estimated Date of Delivery: 03/19/21 and her EGA is [redacted]w[redacted]d. Cycles are irregular. Sometimes she'll go 2-3 months without a period.  Her last pap smear was 12/25/2018 and was AGUS (colpo with CIN 1).    She had a urine pregnancy test which was positive 3 day(s)  ago. Her last menstrual period was normal and lasted for  5 day(s). Since her LMP she claims she has experienced no issues. She denies vaginal bleeding. Her past medical history is noncontributory. Her prior pregnancies are notable for no issues. She had a vaginal delivery that was uncomplicated.   Since her LMP, she admits to the use of tobacco products  no She claims she has gained zeor pounds since the start of her pregnancy.  There are cats in the home in the home  no  She admits close contact with children on a regular basis  yes  She has had chicken pox in the past no She has had Tuberculosis exposures, symptoms, or previously tested positive for TB   no Current or past history of domestic violence. no  Genetic Screening/Teratology Counseling: (Includes patient, baby's father, or anyone in either family with:)   1. Patient's age >/= 63 at [facility]  no 2. Thalassemia ([place], [place], Mediterranean, or Asian background): MCV<80  no 3. Neural tube defect (meningomyelocele, spina bifida, anencephaly)  no 4. Congenital heart defect  no  5. Down syndrome  no 6. Tay-Sachs (Jewish, [place]  no 7. Canavan's Disease  no 8. Sickle cell disease or trait (African)  no  9. Hemophilia or other blood disorders  no  10. Muscular dystrophy  no  11. Cystic fibrosis  no  12. Huntington's Chorea  no  13. Mental retardation/autism  no 14. Other inherited genetic or chromosomal  disorder  no 15. Maternal metabolic disorder (DM, PKU, etc)  no 16. Patient or FOB with a child with a birth defect not listed above no  16a. Patient or FOB with a birth defect themselves no 17. Recurrent pregnancy loss, or stillbirth  no  18. Any medications since LMP other than prenatal vitamins (include vitamins, supplements, OTC meds, drugs, alcohol)  no 19. Any other genetic/environmental exposure to discuss  no  Infection History:   1. Lives with someone with TB or TB exposed  no  2. Patient or partner has history of genital herpes  no 3. Rash or viral illness since LMP  no 4. History of STI (GC, CT, HPV, syphilis, HIV)  no  Other pertinent information:  no     Review of Systems:10 point review of systems negative unless otherwise noted in HPI  Past Medical History:  Diagnosis Date  . Kidney infection    2 yrs ago  . Normal pregnancy     Past Surgical History:  Procedure Laterality Date  . NO PAST SURGERIES      Gynecologic History: Patient's last menstrual period was 06/12/2020.  Obstetric History: G2P1001  Family History  Problem Relation Age of Onset  . Hypertension Maternal Grandmother   . Diabetes Paternal Grandfather     Social History   Socioeconomic History  . Marital status: Single    Spouse name: Not on file  . Number of children: Not on file  .  Years of education: Not on file  . Highest education level: Not on file  Occupational History  . Not on file  Tobacco Use  . Smoking status: Never Smoker  . Smokeless tobacco: Never Used  Vaping Use  . Vaping Use: Never used  Substance and Sexual Activity  . Alcohol use: No  . Drug use: No  . Sexual activity: Not Currently    Birth control/protection: None  Other Topics Concern  . Not on file  Social History Narrative  . Not on file   Social Determinants of Health   Financial Resource Strain: Not on file  Food Insecurity: Not on file  Transportation Needs: Not on file  Physical Activity:  Not on file  Stress: Not on file  Social Connections: Not on file  Intimate Partner Violence: Not on file    No Known Allergies  Prior to Admission medications: denies   Physical Exam BP 116/71   Wt 152 lb (68.9 kg)   LMP 06/12/2020   BMI 27.80 kg/m   Physical Exam Constitutional:      General: She is not in acute distress.    Appearance: Normal appearance. She is well-developed.  Genitourinary:     Vulva, bladder and urethral meatus normal.     Right Labia: No rash, tenderness, lesions, skin changes or Bartholin's cyst.    Left Labia: No tenderness, skin changes, Bartholin's cyst or rash.    No inguinal adenopathy present in the right or left side.    Pelvic Tanner Score: 5/5.     Right Adnexa: not tender, not full and no mass present.    Left Adnexa: not tender, not full and no mass present.    No cervical motion tenderness, friability, lesion or polyp.     Uterus is not enlarged, fixed or tender.     Uterus is anteverted.     No urethral tenderness or mass present.     Pelvic exam was performed with patient in the lithotomy position.  HENT:     Head: Normocephalic and atraumatic.  Eyes:     General: No scleral icterus.    Conjunctiva/sclera: Conjunctivae normal.  Cardiovascular:     Rate and Rhythm: Normal rate and regular rhythm.     Heart sounds: No murmur heard. No friction rub. No gallop.   Pulmonary:     Effort: Pulmonary effort is normal. No respiratory distress.     Breath sounds: Normal breath sounds. No wheezing or rales.  Abdominal:     General: Bowel sounds are normal. There is no distension.     Palpations: Abdomen is soft. There is no mass.     Tenderness: There is no abdominal tenderness. There is no guarding or rebound.     Hernia: There is no hernia in the left inguinal area or right inguinal area.  Musculoskeletal:        General: Normal range of motion.     Cervical back: Normal range of motion and neck supple.  Lymphadenopathy:     Lower  Body: No right inguinal adenopathy. No left inguinal adenopathy.  Neurological:     General: No focal deficit present.     Mental Status: She is alert and oriented to person, place, and time.     Cranial Nerves: No cranial nerve deficit.  Skin:    General: Skin is warm and dry.     Findings: No erythema.  Psychiatric:        Mood and Affect: Mood normal.  Behavior: Behavior normal.        Judgment: Judgment normal.    Female Chaperone present during breast and/or pelvic exam.  Assessment: 30 y.o. G2P1001 at [redacted]w[redacted]d presenting to initiate prenatal care  Plan: 1) Avoid alcoholic beverages. 2) Patient encouraged not to smoke.  3) Discontinue the use of all non-medicinal drugs and chemicals.  4) Take prenatal vitamins daily.  5) Nutrition, food safety (fish, cheese advisories, and high nitrite foods) and exercise discussed. 6) Hospital and practice style discussed with cross coverage system.  7) Genetic Screening, such as with 1st Trimester Screening, cell free fetal DNA, AFP testing, and Ultrasound, as well as with amniocentesis and CVS as appropriate, is discussed with patient. At the conclusion of today's visit patient requested genetic testing 8) Patient is asked about travel to areas at risk for the Zika virus, and counseled to avoid travel and exposure to mosquitoes or sexual partners who may have themselves been exposed to the virus. Testing is discussed, and will be ordered as appropriate.   Thomasene Mohair, MD 08/21/2020 2:01 PM

## 2020-08-21 NOTE — Addendum Note (Signed)
Addended by: Thomasene Mohair D on: 08/21/2020 03:06 PM   Modules accepted: Orders

## 2020-08-22 LAB — RPR+RH+ABO+RUB AB+AB SCR+CB...
Antibody Screen: NEGATIVE
HIV Screen 4th Generation wRfx: NONREACTIVE
Hematocrit: 37.2 % (ref 34.0–46.6)
Hemoglobin: 11.3 g/dL (ref 11.1–15.9)
Hepatitis B Surface Ag: NEGATIVE
MCH: 20.3 pg — ABNORMAL LOW (ref 26.6–33.0)
MCHC: 30.4 g/dL — ABNORMAL LOW (ref 31.5–35.7)
MCV: 67 fL — ABNORMAL LOW (ref 79–97)
Platelets: 285 10*3/uL (ref 150–450)
RBC: 5.56 x10E6/uL — ABNORMAL HIGH (ref 3.77–5.28)
RDW: 22.7 % — ABNORMAL HIGH (ref 11.7–15.4)
RPR Ser Ql: NONREACTIVE
Rh Factor: POSITIVE
Rubella Antibodies, IGG: 5.06 index (ref >0.99)
Varicella zoster IgG: <135 index — ABNORMAL LOW (ref >165)
WBC: 6.3 10*3/uL (ref 3.4–10.8)

## 2020-08-23 LAB — CERVICOVAGINAL ANCILLARY ONLY
Chlamydia: POSITIVE — AB
Comment: NEGATIVE
Comment: NORMAL
Neisseria Gonorrhea: NEGATIVE

## 2020-08-23 LAB — URINE DRUG PANEL 7
Amphetamines, Urine: NEGATIVE ng/mL
Barbiturate Quant, Ur: NEGATIVE ng/mL
Benzodiazepine Quant, Ur: NEGATIVE ng/mL
Cannabinoid Quant, Ur: NEGATIVE ng/mL
Cocaine (Metab.): NEGATIVE ng/mL
Opiate Quant, Ur: NEGATIVE ng/mL
PCP Quant, Ur: NEGATIVE ng/mL

## 2020-08-23 LAB — URINE CULTURE: Organism ID, Bacteria: NO GROWTH

## 2020-08-29 ENCOUNTER — Ambulatory Visit: Payer: Medicaid Other

## 2020-08-29 ENCOUNTER — Encounter: Payer: Medicaid Other | Admitting: Advanced Practice Midwife

## 2020-08-29 ENCOUNTER — Telehealth: Payer: Self-pay | Admitting: Obstetrics and Gynecology

## 2020-08-29 ENCOUNTER — Other Ambulatory Visit: Payer: Self-pay

## 2020-08-29 ENCOUNTER — Encounter: Payer: Self-pay | Admitting: Advanced Practice Midwife

## 2020-08-29 ENCOUNTER — Ambulatory Visit (INDEPENDENT_AMBULATORY_CARE_PROVIDER_SITE_OTHER): Payer: Medicaid Other | Admitting: Advanced Practice Midwife

## 2020-08-29 VITALS — BP 118/74 | Wt 151.0 lb

## 2020-08-29 DIAGNOSIS — O209 Hemorrhage in early pregnancy, unspecified: Secondary | ICD-10-CM | POA: Diagnosis not present

## 2020-08-29 DIAGNOSIS — Z3480 Encounter for supervision of other normal pregnancy, unspecified trimester: Secondary | ICD-10-CM

## 2020-08-29 DIAGNOSIS — Z3A01 Less than 8 weeks gestation of pregnancy: Secondary | ICD-10-CM

## 2020-08-29 DIAGNOSIS — Z3481 Encounter for supervision of other normal pregnancy, first trimester: Secondary | ICD-10-CM

## 2020-08-29 DIAGNOSIS — O208 Other hemorrhage in early pregnancy: Secondary | ICD-10-CM

## 2020-08-29 DIAGNOSIS — Z3A10 10 weeks gestation of pregnancy: Secondary | ICD-10-CM

## 2020-08-29 LAB — CYTOLOGY - PAP
Chlamydia: POSITIVE — AB
Comment: NEGATIVE
Comment: NEGATIVE
Comment: NEGATIVE
Comment: NORMAL
HPV 16: NEGATIVE
HPV 18 / 45: POSITIVE — AB
High risk HPV: POSITIVE — AB
Neisseria Gonorrhea: NEGATIVE

## 2020-08-29 NOTE — Progress Notes (Signed)
Pt had some light spotting Monday. No lof.

## 2020-08-29 NOTE — Patient Instructions (Signed)

## 2020-08-29 NOTE — Telephone Encounter (Signed)
Left generic VM for patient. I need to discuss her + chlamydia test and treatment with her

## 2020-08-29 NOTE — Progress Notes (Signed)
  Routine Prenatal Care Visit  Subjective  Dawn Simon is a 30 y.o. G2P1001 at [redacted]w[redacted]d being seen today for ongoing prenatal care.  She is currently monitored for the following issues for this low-risk pregnancy and has Irregular contractions; Pyelonephritis; History of recurrent UTI (urinary tract infection); Left flank pain; Nausea; Atypical glandular cells of undetermined significance (AGUS) on cervical Pap smear; Dysplasia of cervix, low grade (CIN 1); [redacted] weeks gestation of pregnancy; and Supervision of other normal pregnancy, antepartum on their problem list.  ----------------------------------------------------------------------------------- Patient reports an episode of spotting on Monday- three days ago. She had some spots when wiping and on a pad. She had mild cramping as well. No bleeding or cramping since then. She was supposed to have had dating scan today although u/s tech is out. She will return in 1 week for dating.    . Vag. Bleeding: Other.   . Leaking Fluid denies.  ----------------------------------------------------------------------------------- The following portions of the patient's history were reviewed and updated as appropriate: allergies, current medications, past family history, past medical history, past social history, past surgical history and problem list. Problem list updated.  Objective  Blood pressure 118/74, weight 151 lb (68.5 kg), last menstrual period 06/12/2020. Pregravid weight 152 lb (68.9 kg) Total Weight Gain -1 lb (-0.454 kg) Urinalysis: Urine Protein    Urine Glucose    Fetal Status: Fetal Heart Rate (bpm): present          Bedside ultrasound: I was unable to hear fetal heart beat with doppler and unable to discern on abdominal ultrasound. I asked Dr Tiburcio Pea to assess. He was able to locate heart beat and measure CRL.   General:  Alert, oriented and cooperative. Patient is in no acute distress.  Skin: Skin is warm and dry. No rash noted.    Cardiovascular: Normal heart rate noted  Respiratory: Normal respiratory effort, no problems with respiration noted  Abdomen: Soft, gravid, appropriate for gestational age.       Pelvic:  Cervical exam deferred        Extremities: Normal range of motion.     Mental Status: Normal mood and affect. Normal behavior. Normal judgment and thought content.   Assessment   30 y.o. G2P1001 at [redacted]w[redacted]d by  04/12/2021, by Ultrasound presenting for routine prenatal visit  Plan   pregnancy Problems (from 08/21/20 to present)    Problem Noted Resolved   Supervision of other normal pregnancy, antepartum 08/21/2020 by Conard Novak, MD No       Preterm labor symptoms and general obstetric precautions including but not limited to vaginal bleeding, contractions, leaking of fluid and fetal movement were reviewed in detail with the patient. Please refer to After Visit Summary for other counseling recommendations.   Return for scheduled prenatal visit/ultrasound.  Tresea Mall, CNM 08/29/2020 9:16 AM

## 2020-08-29 NOTE — Progress Notes (Signed)
ULTRASOUND REPORT  Location: [facility] Scan by Dr Tiburcio Pea Date of Service: 08/29/2020   Indications:Irreg cycles and first trimester bleeding Findings:  Mason Jim intrauterine pregnancy is visualized with a CRL consistent with [redacted]w[redacted]d gestation, giving an (U/S) EDD of 04/12/2021. The (U/S) EDD is not consistent with the clinically established EDD of 03/19/2021.  FHR: 150 BPM Yolk sac is not visualized. Amnion: not visualized   Right Ovary is normal in appearance. Left Ovary is normal appearance. Corpus luteal cyst:  is not visualized Survey of the adnexa demonstrates no adnexal masses. There is no free peritoneal fluid in the cul de sac.  Impression: 1. [redacted]w[redacted]d Viable Singleton Intrauterine pregnancy by U/S. 2. (U/S) EDD is consistent with Clinically established EDD of 04/12/2021.  Recommendations: 1.Clinical correlation with the patient's History and Physical Exam. 2. Will adjust EDC to new date 3. Will follow up w additional [place] to monitor vag bleeding of first trimester  Letitia Libra, MD

## 2020-08-30 ENCOUNTER — Other Ambulatory Visit: Payer: Self-pay | Admitting: Obstetrics and Gynecology

## 2020-08-30 ENCOUNTER — Telehealth: Payer: Self-pay

## 2020-08-30 DIAGNOSIS — A749 Chlamydial infection, unspecified: Secondary | ICD-10-CM

## 2020-08-30 DIAGNOSIS — Z3481 Encounter for supervision of other normal pregnancy, first trimester: Secondary | ICD-10-CM

## 2020-08-30 MED ORDER — AZITHROMYCIN 500 MG PO TABS: 1000.0000 mg | ORAL_TABLET | Freq: Once | ORAL | 0 refills | Status: AC

## 2020-08-30 NOTE — Telephone Encounter (Signed)
Called pt to inform her of lab results with + chlamydia. Pt aware that partner needs to be treated as well. Dr Jean Rosenthal sending in RX to pt pharm.

## 2020-09-03 ENCOUNTER — Other Ambulatory Visit: Payer: Self-pay

## 2020-09-03 ENCOUNTER — Ambulatory Visit (INDEPENDENT_AMBULATORY_CARE_PROVIDER_SITE_OTHER): Payer: Medicaid Other

## 2020-09-03 ENCOUNTER — Ambulatory Visit (INDEPENDENT_AMBULATORY_CARE_PROVIDER_SITE_OTHER): Payer: Medicaid Other | Admitting: Obstetrics and Gynecology

## 2020-09-03 VITALS — BP 118/72 | Wt 163.0 lb

## 2020-09-03 DIAGNOSIS — Z3A09 9 weeks gestation of pregnancy: Secondary | ICD-10-CM

## 2020-09-03 DIAGNOSIS — Z3481 Encounter for supervision of other normal pregnancy, first trimester: Secondary | ICD-10-CM

## 2020-09-03 DIAGNOSIS — Z3480 Encounter for supervision of other normal pregnancy, unspecified trimester: Secondary | ICD-10-CM | POA: Diagnosis not present

## 2020-09-03 DIAGNOSIS — O208 Other hemorrhage in early pregnancy: Secondary | ICD-10-CM

## 2020-09-03 DIAGNOSIS — O98811 Other maternal infectious and parasitic diseases complicating pregnancy, first trimester: Secondary | ICD-10-CM

## 2020-09-03 DIAGNOSIS — Z3A10 10 weeks gestation of pregnancy: Secondary | ICD-10-CM | POA: Diagnosis not present

## 2020-09-03 DIAGNOSIS — A749 Chlamydial infection, unspecified: Secondary | ICD-10-CM

## 2020-09-03 LAB — POCT URINALYSIS DIPSTICK OB
Glucose, UA: NEGATIVE
POC,PROTEIN,UA: NEGATIVE

## 2020-09-03 MED ORDER — AZITHROMYCIN 500 MG PO TABS: 1000.0000 mg | ORAL_TABLET | Freq: Once | ORAL | 0 refills | Status: AC

## 2020-09-03 MED ORDER — CITRANATAL ASSURE 35-1 & 300 MG PO MISC: 2.0000 | Freq: Every day | ORAL | 3 refills | Status: AC

## 2020-09-03 NOTE — Progress Notes (Signed)
    Routine Prenatal Care Visit  Subjective  Dawn Simon is a 30 y.o. G2P1001 at [redacted]w[redacted]d being seen today for ongoing prenatal care.  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  ----------------------------------------------------------------------------------- Patient reports no complaints.  Patient requesting antibiotic be recent to pharmacy.   . Vag. Bleeding: None.   . Denies leaking of fluid.  ----------------------------------------------------------------------------------- The following portions of the patient's history were reviewed and updated as appropriate: allergies, current medications, past family history, past medical history, past social history, past surgical history and problem list. Problem list updated.   Objective  Blood pressure 118/72, weight 163 lb (73.9 kg), last menstrual period 06/12/2020. Pregravid weight 152 lb (68.9 kg) Total Weight Gain 11 lb (4.99 kg) Urinalysis:      Fetal Status: Fetal Heart Rate (bpm): 165 ([place])         General:  Alert, oriented and cooperative. Patient is in no acute distress.  Skin: Skin is warm and dry. No rash noted.   Cardiovascular: Normal heart rate noted  Respiratory: Normal respiratory effort, no problems with respiration noted  Abdomen: Soft, gravid, appropriate for gestational age. Pain/Pressure: Absent     Pelvic:  Cervical exam deferred        Extremities: Normal range of motion.     ental Status: Normal mood and affect. Normal behavior. Normal judgment and thought content.     Assessment   30 y.o. G2P1001 at [redacted]w[redacted]d by  04/03/2021, by Ultrasound presenting for  routine prenatal visit  Plan   pregnancy Problems (from 08/21/20 to present)    Problem Noted Resolved   Supervision of other normal pregnancy, antepartum 08/21/2020 by Conard Novak, MD No      -PNV rx'd -Reorder azithromycin for chlamydial infection. Reviewed importance of treatment during pregnancy for patient and her partner. Discussed safe sex practices. Patient stated understanding.  First trimester precautions including but not limited to vaginal bleeding, contractions, leaking of fluid and fetal movement were reviewed in detail with the patient.    Return in about 4 weeks (around 10/01/2020) for ROB.  Zipporah Plants, CNM, MSN [facility], [facility] 09/03/2020, 2:54 PM

## 2020-09-05 ENCOUNTER — Encounter: Payer: Medicaid Other | Admitting: Obstetrics and Gynecology

## 2020-09-05 ENCOUNTER — Ambulatory Visit: Payer: Medicaid Other

## 2020-10-01 ENCOUNTER — Other Ambulatory Visit: Payer: Self-pay

## 2020-10-01 ENCOUNTER — Ambulatory Visit (INDEPENDENT_AMBULATORY_CARE_PROVIDER_SITE_OTHER): Payer: Medicaid Other | Admitting: Obstetrics and Gynecology

## 2020-10-01 ENCOUNTER — Encounter: Payer: Self-pay | Admitting: Obstetrics and Gynecology

## 2020-10-01 VITALS — BP 110/70 | Ht 62.0 in | Wt 152.6 lb

## 2020-10-01 DIAGNOSIS — A749 Chlamydial infection, unspecified: Secondary | ICD-10-CM

## 2020-10-01 DIAGNOSIS — O98811 Other maternal infectious and parasitic diseases complicating pregnancy, first trimester: Secondary | ICD-10-CM

## 2020-10-01 DIAGNOSIS — H5712 Ocular pain, left eye: Secondary | ICD-10-CM

## 2020-10-01 DIAGNOSIS — Z3A13 13 weeks gestation of pregnancy: Secondary | ICD-10-CM

## 2020-10-01 DIAGNOSIS — Z3480 Encounter for supervision of other normal pregnancy, unspecified trimester: Secondary | ICD-10-CM

## 2020-10-01 DIAGNOSIS — Z1379 Encounter for other screening for genetic and chromosomal anomalies: Secondary | ICD-10-CM

## 2020-10-01 LAB — POCT URINALYSIS DIPSTICK OB
Glucose, UA: NEGATIVE
POC,PROTEIN,UA: NEGATIVE

## 2020-10-01 MED ORDER — AZITHROMYCIN 500 MG PO TABS: 1000.0000 mg | ORAL_TABLET | Freq: Once | ORAL | 0 refills | Status: AC

## 2020-10-01 NOTE — Progress Notes (Signed)
Routine Prenatal Care Visit  Subjective  Dawn Simon is a 30 y.o. G2P1001 at [redacted]w[redacted]d being seen today for ongoing prenatal care.  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  ----------------------------------------------------------------------------------- Patient reports she has not yet taken her treatment for chlamydia.  She reports she halso has had 4 months of left eye pain.   Contractions: Not present. Vag. Bleeding: None.  Movement: Absent. Denies leaking of fluid.  ----------------------------------------------------------------------------------- The following portions of the patient's history were reviewed and updated as appropriate: allergies, current medications, past family history, past medical history, past social history, past surgical history and problem list. Problem list updated.   Objective  Blood pressure 110/70, height 5\' 2"  (1.575 m), weight 152 lb 9.6 oz (69.2 kg), last menstrual period 06/12/2020. Pregravid weight 152 lb (68.9 kg) Total Weight Gain 9.6 oz (0.272 kg) Urinalysis:      Fetal Status: Fetal Heart Rate (bpm): 150   Movement: Absent     General:  Alert, oriented and cooperative. Patient is in no acute distress.  Skin: Skin is warm and dry. No rash noted.   Cardiovascular: Normal heart rate noted  Respiratory: Normal respiratory effort, no problems with respiration noted  Abdomen: Soft, gravid, appropriate for gestational age. Pain/Pressure: Absent     Pelvic:  Cervical exam deferred        Extremities: Normal range of motion.  Edema: None  Mental Status: Normal mood and affect. Normal  behavior. Normal judgment and thought content.     Assessment   30 y.o. G2P1001 at 108w5d by  04/03/2021, by Ultrasound presenting for routine prenatal visit  Plan   pregnancy Problems (from 08/21/20 to present)    Problem Noted Resolved   Supervision of other normal pregnancy, antepartum 08/21/2020 by 10/19/2020, MD No   Overview Addendum 10/01/2020  9:24 AM by 10/03/2020, MD     Nursing Staff Provider  Office Location  [facility] Dating   9 wk Natale Milch  Language  English Anatomy US    Flu Vaccine  May consider next visit Genetic Screen  NIPS:   TDaP vaccine    Hgb A1C or  GTT Early : Third trimester :   Covid  vaccinated   LAB RESULTS   Rhogam  Not needed Blood Type O/Positive/-- (02/09 1510)   Feeding Plan  Antibody Negative (02/09 1510)  Contraception  Rubella 5.06 (02/09 1510)  Circumcision  RPR Non Reactive (02/09 1510)   Pediatrician   HBsAg Negative (02/09 1510)   Support Person  HIV Non Reactive (02/09 1510)  Prenatal Classes  Varicella  nonimmune    GBS  (For PCN allergy, check sensitivities)   BTL Consent     VBAC Consent  Pap  2022 LSIL, HPV + Colposcopy desired    Hgb Electro      CF      SMA         Chlamydia + in pregnancy- untreated, [ ]  TOC      Previous Version       Resent rx for azithromycin Genetic screening test today Discussed abnormal pap, patient prefers colposcopy during pregnancy- will schedule Reports she would like flu shot next visit  Gestational age  appropriate obstetric precautions including but not limited to vaginal bleeding, contractions, leaking of fluid and fetal movement were reviewed in detail with the patient.    Return in about 4 weeks (around 10/29/2020) for ROB in 4 weeks, colposcopy in 2 weeks .  Natale Milch MD [facility], [facility] 10/01/2020, 9:24 AM

## 2020-10-01 NOTE — Patient Instructions (Signed)

## 2020-10-08 LAB — MATERNIT21 PLUS CORE+SCA
Fetal Fraction: 9
Monosomy X (Turner Syndrome): NOT DETECTED
Result (T21): NEGATIVE
Trisomy 13 (Patau syndrome): NEGATIVE
Trisomy 18 (Edwards syndrome): NEGATIVE
Trisomy 21 (Down syndrome): NEGATIVE
XXX (Triple X Syndrome): NOT DETECTED
XXY (Klinefelter Syndrome): NOT DETECTED
XYY (Jacobs Syndrome): NOT DETECTED

## 2020-10-18 ENCOUNTER — Ambulatory Visit (INDEPENDENT_AMBULATORY_CARE_PROVIDER_SITE_OTHER): Payer: Medicaid Other | Admitting: Obstetrics and Gynecology

## 2020-10-18 ENCOUNTER — Other Ambulatory Visit (HOSPITAL_COMMUNITY)
Admission: RE | Admit: 2020-10-18 | Discharge: 2020-10-18 | Disposition: A | Discharge status: Home / Self Care | Payer: Medicaid Other | Source: Ambulatory Visit | Attending: Obstetrics and Gynecology | Admitting: Obstetrics and Gynecology

## 2020-10-18 ENCOUNTER — Encounter: Payer: Self-pay | Admitting: Obstetrics and Gynecology

## 2020-10-18 ENCOUNTER — Other Ambulatory Visit: Payer: Self-pay

## 2020-10-18 VITALS — BP 118/72 | Wt 164.0 lb

## 2020-10-18 DIAGNOSIS — B977 Papillomavirus as the cause of diseases classified elsewhere: Secondary | ICD-10-CM | POA: Diagnosis present

## 2020-10-18 DIAGNOSIS — N72 Inflammatory disease of cervix uteri: Secondary | ICD-10-CM | POA: Insufficient documentation

## 2020-10-18 DIAGNOSIS — R87612 Low grade squamous intraepithelial lesion on cytologic smear of cervix (LGSIL): Secondary | ICD-10-CM | POA: Diagnosis not present

## 2020-10-18 NOTE — Progress Notes (Signed)
   GYNECOLOGY CLINIC COLPOSCOPY PROCEDURE NOTE  30 y.o. G2P1001 here for colposcopy for low-grade squamous intraepithelial neoplasia (LGSIL - encompassing HPV,mild dysplasia,CIN I)  pap smear on 08/21/2020. Discussed underlying role for HPV infection in the development of cervical dysplasia, its natural history and progression/regression, need for surveillance.  Is the patient  pregnant: Yes LMP: Patient's last menstrual period was 06/12/2020. Smoking status:  reports that she has never smoked. She has never used smokeless tobacco.  Patient given informed consent, signed copy in the chart, time out was performed.  The patient was position in dorsal lithotomy position. Speculum was placed the cervix was visualized.   After application of acetic acid colposcopic inspection of the cervix was undertaken.   Colposcopy adequate, full visualization of transformation zone: Yes Prominent anterior ectropion with some aceto white changes and friability; corresponding biopsies obtained.  11 O'Clock biopsy obtained ECC specimen obtained:  No  All specimens were labeled and sent to pathology.   Patient was given post procedure instructions.  Will follow up pathology and manage accordingly.  Routine preventative health maintenance measures emphasized.  OBGyn Exam  Vena Austria, MD, [facility], [facility]

## 2020-10-21 LAB — SURGICAL PATHOLOGY

## 2020-10-23 ENCOUNTER — Encounter: Payer: Self-pay | Admitting: Obstetrics and Gynecology

## 2020-10-23 DIAGNOSIS — N871 Moderate cervical dysplasia: Secondary | ICD-10-CM | POA: Insufficient documentation

## 2020-10-29 ENCOUNTER — Ambulatory Visit (INDEPENDENT_AMBULATORY_CARE_PROVIDER_SITE_OTHER): Payer: Medicaid Other | Admitting: Obstetrics

## 2020-10-29 ENCOUNTER — Other Ambulatory Visit (HOSPITAL_COMMUNITY)
Admission: RE | Admit: 2020-10-29 | Discharge: 2020-10-29 | Disposition: A | Discharge status: Home / Self Care | Payer: Medicaid Other | Source: Ambulatory Visit | Attending: Obstetrics | Admitting: Obstetrics

## 2020-10-29 ENCOUNTER — Other Ambulatory Visit: Payer: Self-pay

## 2020-10-29 VITALS — BP 120/80 | Wt 152.0 lb

## 2020-10-29 DIAGNOSIS — O98811 Other maternal infectious and parasitic diseases complicating pregnancy, first trimester: Secondary | ICD-10-CM | POA: Diagnosis present

## 2020-10-29 DIAGNOSIS — O98319 Other infections with a predominantly sexual mode of transmission complicating pregnancy, unspecified trimester: Secondary | ICD-10-CM | POA: Insufficient documentation

## 2020-10-29 DIAGNOSIS — Z3A17 17 weeks gestation of pregnancy: Secondary | ICD-10-CM

## 2020-10-29 DIAGNOSIS — A568 Sexually transmitted chlamydial infection of other sites: Secondary | ICD-10-CM

## 2020-10-29 DIAGNOSIS — A749 Chlamydial infection, unspecified: Secondary | ICD-10-CM | POA: Insufficient documentation

## 2020-10-29 DIAGNOSIS — Z3A21 21 weeks gestation of pregnancy: Secondary | ICD-10-CM

## 2020-10-29 DIAGNOSIS — Z3480 Encounter for supervision of other normal pregnancy, unspecified trimester: Secondary | ICD-10-CM

## 2020-10-29 LAB — POCT URINALYSIS DIPSTICK OB: Glucose, UA: NEGATIVE

## 2020-10-29 NOTE — Progress Notes (Signed)
Routine Prenatal Care Visit  Subjective  Dawn Simon is a 30 y.o. G2P1001 at [redacted]w[redacted]d being seen today for ongoing prenatal care.  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  ----------------------------------------------------------------------------------- Patient reports no complaints.   Contractions: Not present. Vag. Bleeding: None.  Movement: Absent. Leaking Fluid denies.  ----------------------------------------------------------------------------------- The following portions of the patient's history were reviewed and updated as appropriate: allergies, current medications, past family history, past medical history, past social history, past surgical history and problem list. Problem list updated.  Objective  Blood pressure 120/80, weight 152 lb (68.9 kg), last menstrual period 06/12/2020. Pregravid weight 152 lb (68.9 kg) Total Weight Gain 0 lb (0 kg) Urinalysis: Urine Protein    Urine Glucose    Fetal Status:     Movement: Absent     General:  Alert, oriented and cooperative. Patient is in no acute distress.  Skin: Skin is warm and dry. No rash noted.   Cardiovascular: Normal heart rate noted  Respiratory: Normal respiratory effort, no problems with respiration noted  Abdomen: Soft, gravid, appropriate for gestational age. Pain/Pressure: Absent     Pelvic:  Cervical exam deferred        Extremities: Normal range of motion.     Mental Status: Normal mood and affect. Normal behavior. Normal judgment and thought content.    Assessment   30 y.o. G2P1001 at [redacted]w[redacted]d by  04/03/2021, by Ultrasound presenting for routine prenatal visit  Plan   pregnancy Problems (from 08/21/20 to present)    Problem Noted Resolved   Chlamydia trachomatis infection in pregnancy 10/29/2020 by Mirna Mires, CNM No   Overview Signed 10/29/2020  9:26 AM by Mirna Mires, CNM    +CZ at NOB.  TOC 4/19      CIN II (cervical intraepithelial neoplasia II) 10/23/2020 by Vena Austria, MD No   Overview Signed 10/23/2020  6:09 PM by Vena Austria, MD    Patients diagnosed with high grade lesions (CIN 2 or 3) or adenocarcinoma in situ (AIS) during pregnancy should undergo surveillance via colposcopy and age-based testing (cytology/HPV) every 12-24 weeks. The choice of surveillance interval is individualized based on the gestational age of the fetus, level of experience of colposcopist, and risk for loss of follow-up. Repeat biopsy is recommended if the lesion worsens or invasion is suspected. Deferring repeat colposcopy to postpartum is acceptable.  Management of Abnormal Cervical Cancer Screening Tests in Pregnancy ASCCP 2021-2022 Practice Committee December 19, 2019        Supervision of other normal pregnancy, antepartum 08/21/2020 by Conard Novak, MD No   Overview Addendum 10/29/2020  9:25 AM by Mirna Mires, CNM     Nursing Staff Provider  Office Location  [facility] Dating   9 wk [place]  Language  English Anatomy US    Flu Vaccine  May consider next visit Genetic Screen  NIPS:   TDaP vaccine    Hgb A1C or  GTT Early : Third trimester :   Covid  vaccinated   LAB RESULTS   Rhogam  Not needed Blood Type O/Positive/-- (02/09 1510)   Feeding Plan  Antibody Negative (02/09 1510)  Contraception  Rubella 5.06 (02/09 1510)  Circumcision  RPR Non Reactive (02/09 1510)   Pediatrician   HBsAg Negative (02/09 1510)   Support Person  HIV Non Reactive (02/09 1510)  Prenatal Classes  Varicella  nonimmune    GBS  (For PCN allergy,  check sensitivities)   BTL Consent     VBAC Consent  Pap  2022 LSIL, HPV + Colposcopy desired    Hgb Electro      CF      SMA         Chlamydia + in pregnancy- untreated, [ x] TOC on 10/29/20      Previous Version       Preterm labor symptoms and general obstetric precautions including but not limited to vaginal bleeding, contractions, leaking of fluid and fetal movement were reviewed in detail with the patient. Please refer to After Visit Summary for other counseling recommendations.  A TOC for her earlier + Chlamydia is retrieved today. Order for anatomy scan in 4 weeks is scheduled. Briefly discussed the results of her colpo. She understands that she needs follow up after delivery for the HGSIL findings. She is anticipating that Dr. Bonney Aid will be calling her to explainn in greater detail the results.  Return in about 4 weeks (around 11/26/2020) for return OB, anatomy scan.  Mirna Mires, CNM  10/29/2020 9:26 AM

## 2020-10-29 NOTE — Addendum Note (Signed)
Addended by: Cornelius Moras D on: 10/29/2020 09:48 AM   Modules accepted: Orders

## 2020-10-30 LAB — URINE CYTOLOGY ANCILLARY ONLY
Chlamydia: NEGATIVE
Comment: NEGATIVE
Comment: NORMAL
Neisseria Gonorrhea: NEGATIVE

## 2020-11-20 ENCOUNTER — Ambulatory Visit
Admission: RE | Admit: 2020-11-20 | Discharge: 2020-11-20 | Disposition: A | Discharge status: Home / Self Care | Payer: Medicaid Other | Source: Ambulatory Visit | Attending: Obstetrics | Admitting: Obstetrics

## 2020-11-20 ENCOUNTER — Other Ambulatory Visit: Payer: Self-pay

## 2020-11-20 DIAGNOSIS — Z3480 Encounter for supervision of other normal pregnancy, unspecified trimester: Secondary | ICD-10-CM | POA: Insufficient documentation

## 2020-11-20 DIAGNOSIS — Z3A21 21 weeks gestation of pregnancy: Secondary | ICD-10-CM | POA: Diagnosis present

## 2020-11-21 ENCOUNTER — Encounter: Payer: Self-pay | Admitting: Obstetrics and Gynecology

## 2020-11-21 ENCOUNTER — Ambulatory Visit (INDEPENDENT_AMBULATORY_CARE_PROVIDER_SITE_OTHER): Payer: Medicaid Other | Admitting: Obstetrics and Gynecology

## 2020-11-21 VITALS — BP 100/70 | Ht 62.0 in | Wt 156.8 lb

## 2020-11-21 DIAGNOSIS — Z3A21 21 weeks gestation of pregnancy: Secondary | ICD-10-CM

## 2020-11-21 DIAGNOSIS — Z3480 Encounter for supervision of other normal pregnancy, unspecified trimester: Secondary | ICD-10-CM

## 2020-11-21 NOTE — Patient Instructions (Signed)

## 2020-11-21 NOTE — Progress Notes (Signed)
Routine Prenatal Care Visit  Subjective  Dawn Simon is a 30 y.o. G2P1001 at [redacted]w[redacted]d being seen today for ongoing prenatal care.  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  ----------------------------------------------------------------------------------- Patient reports no complaints.   Contractions: Not present. Vag. Bleeding: None.  Movement: Present. Denies leaking of fluid.  ----------------------------------------------------------------------------------- The following portions of the patient's history were reviewed and updated as appropriate: allergies, current medications, past family history, past medical history, past social history, past surgical history and problem list. Problem list updated.   Objective  Blood pressure 100/70, height 5\' 2"  (1.575 m), weight 156 lb 12.8 oz (71.1 kg), last menstrual period 06/12/2020. Pregravid weight 152 lb (68.9 kg) Total Weight Gain 4 lb 12.8 oz (2.177 kg) Urinalysis:      Fetal Status: Fetal Heart Rate (bpm): 145 Fundal Height: 20 cm Movement: Present     General:  Alert, oriented and cooperative. Patient is in no acute distress.  Skin: Skin is warm and dry. No rash noted.   Cardiovascular: Normal heart rate noted  Respiratory: Normal respiratory effort, no problems with respiration noted  Abdomen: Soft, gravid, appropriate for gestational age. Pain/Pressure: Absent     Pelvic:  Cervical exam deferred        Extremities: Normal range of motion.  Edema: None  Mental Status: Normal mood  and affect. Normal behavior. Normal judgment and thought content.     Assessment   30 y.o. G2P1001 at [redacted]w[redacted]d by  04/03/2021, by Ultrasound presenting for routine prenatal visit  Plan   pregnancy Problems (from 08/21/20 to present)    Problem Noted Resolved   Chlamydia trachomatis infection in pregnancy 10/29/2020 by 10/31/2020, CNM No   Overview Signed 10/29/2020  9:26 AM by 10/31/2020, CNM    +CZ at NOB.  TOC 4/19      CIN II (cervical intraepithelial neoplasia II) 10/23/2020 by 10/25/2020, MD No   Overview Signed 10/23/2020  6:09 PM by 10/25/2020, MD    Patients diagnosed with high grade lesions (CIN 2 or 3) or adenocarcinoma in situ (AIS) during pregnancy should undergo surveillance via colposcopy and age-based testing (cytology/HPV) every 12-24 weeks. The choice of surveillance interval is individualized based on the gestational age of the fetus, level of experience of colposcopist, and risk for loss of follow-up. Repeat biopsy is recommended if the lesion worsens or invasion is suspected. Deferring repeat colposcopy to postpartum is acceptable.  Management of Abnormal Cervical Cancer Screening Tests in Pregnancy ASCCP 2021-2022 Practice Committee December 19, 2019        Supervision of other normal pregnancy, antepartum 08/21/2020 by 10/19/2020, MD No   Overview Addendum 11/21/2020  9:08 AM by 01/21/2021, MD     Nursing Staff Provider  Office Location  [facility] Dating   9 wk Natale Milch  Language  English Anatomy US    Flu Vaccine  May consider next visit Genetic Screen  NIPS: xx, female  TDaP vaccine    Hgb A1C or  GTT Early : Third trimester :   Covid  vaccinated   LAB RESULTS   Rhogam  Not  needed Blood Type O/Positive/-- (02/09 1510)   Feeding Plan  pump and bottle feed Antibody Negative (02/09 1510)  Contraception MIrena Rubella 5.06 (02/09 1510)  Circumcision NA RPR Non Reactive (02/09 1510)   Pediatrician   HBsAg Negative (02/09 1510)    Support Person  HIV Non Reactive (02/09 1510)  Prenatal Classes discussed Varicella  nonimmune    GBS  (For PCN allergy, check sensitivities)   BTL Consent     VBAC Consent  Pap  2022 LSIL, HPV + Colposcopy desired    Hgb Electro      CF      SMA         Chlamydia + in pregnancy- untreated, [ x] TOC on 10/29/20      Previous Version       Discussed contraception options and breast feeding. She is most interested in an IUD.  Discussed breastfeeding resources  Gestational age appropriate obstetric precautions including but not limited to vaginal bleeding, contractions, leaking of fluid and fetal movement were reviewed in detail with the patient.    Return in about 4 weeks (around 12/19/2020) for ROB in person.  Natale Milch MD [facility], [facility] 11/21/2020, 9:09 AM

## 2020-11-26 ENCOUNTER — Encounter: Payer: Medicaid Other | Admitting: Obstetrics and Gynecology

## 2022-11-18 IMAGING — US US OB COMP +14 WK
1 of 2 series · 13 of 28 positions shown · non-contrast
Comparison: none

CLINICAL DATA: 30-year-old pregnant female presents for fetal
anatomic survey.

EXAM:
OBSTETRICAL ULTRASOUND >14 WKS

[Series 1: us ob comp +14 wk · 0.20mm/px · 13 of 81 slices shown]
[im 4/81]
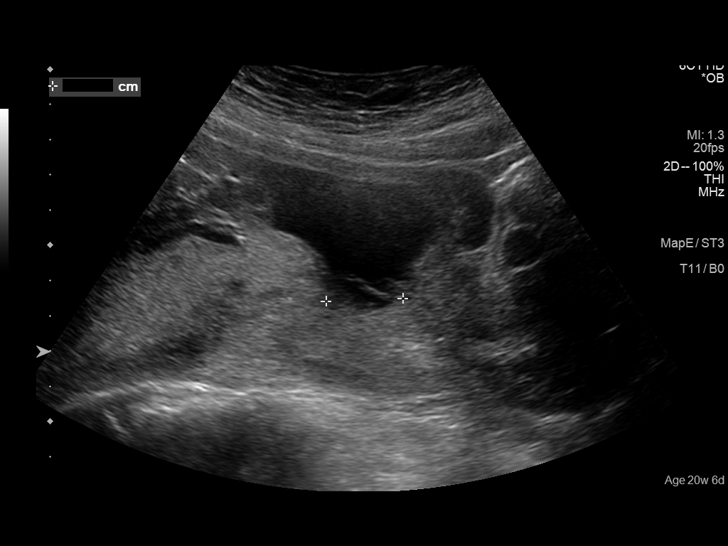
[im 10/81]
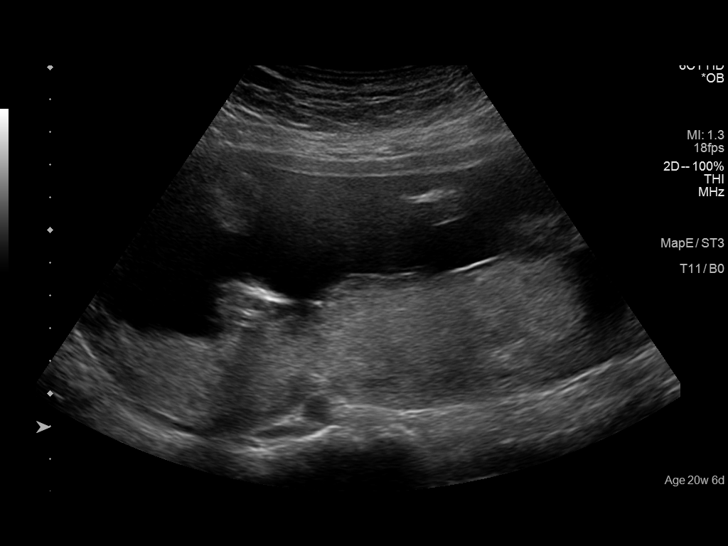
[im 16/81]
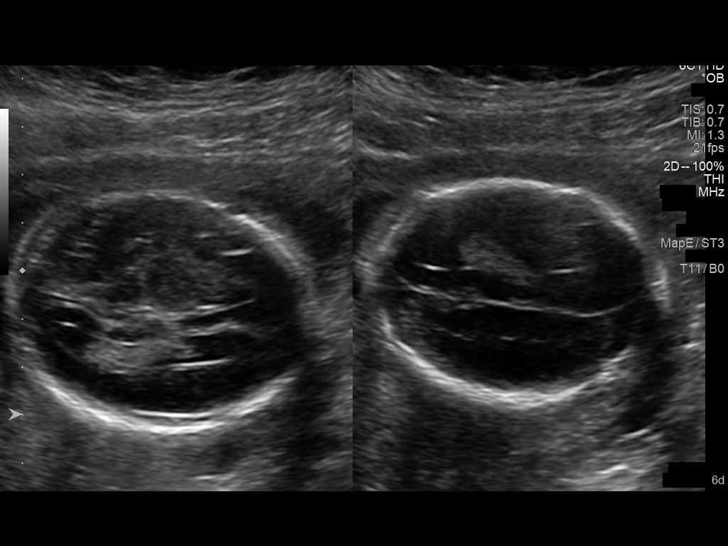
[im 22/81]
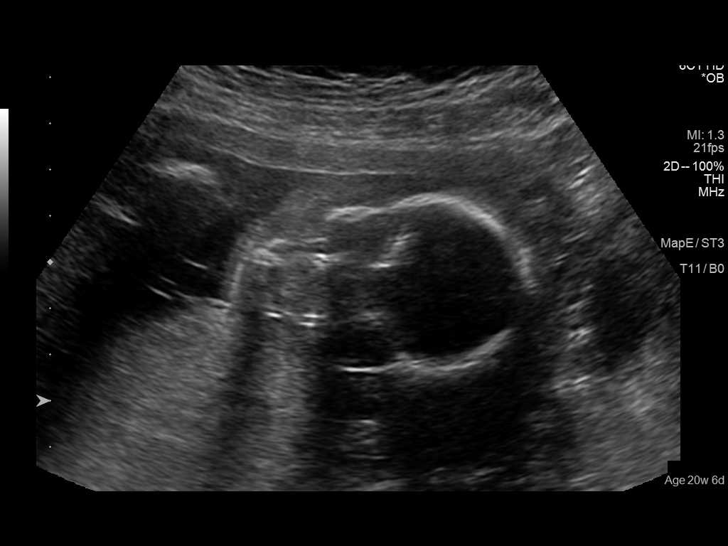
[im 28/81]
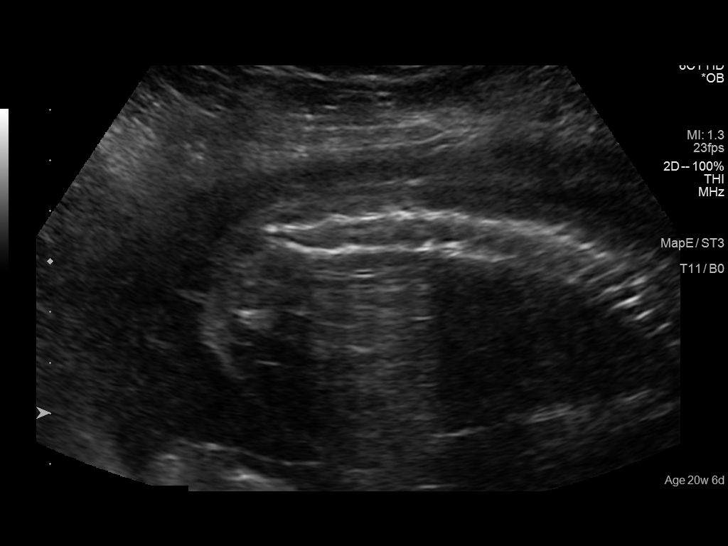
[im 34/81]
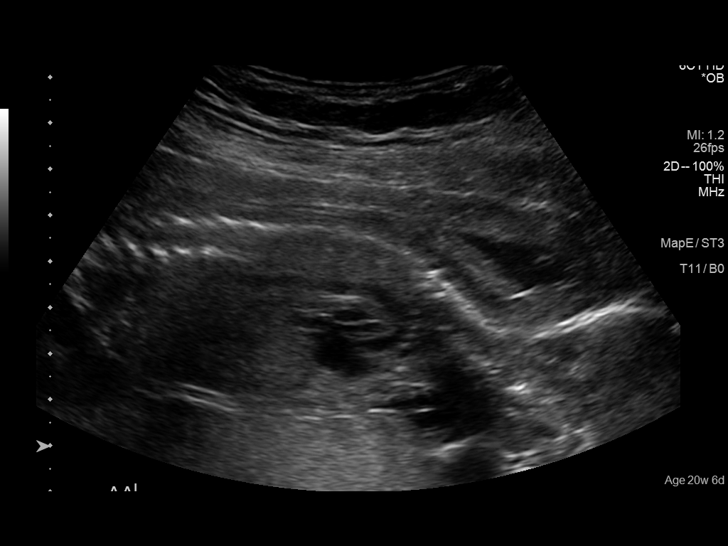
[im 44/81]
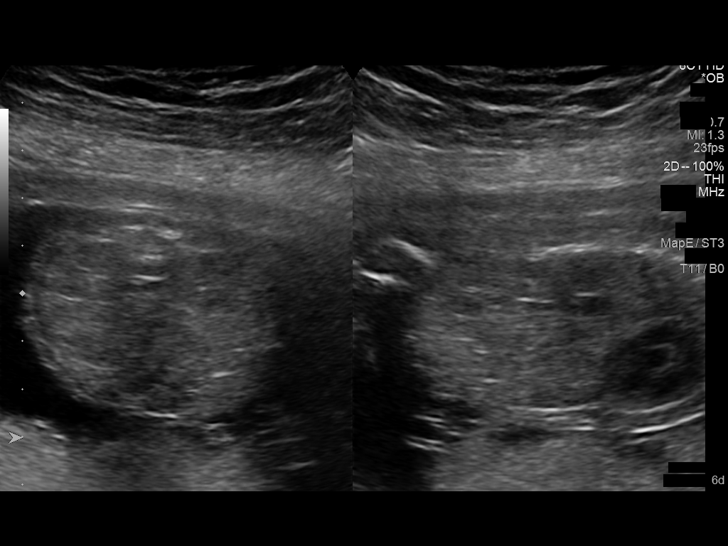
[im 50/81]
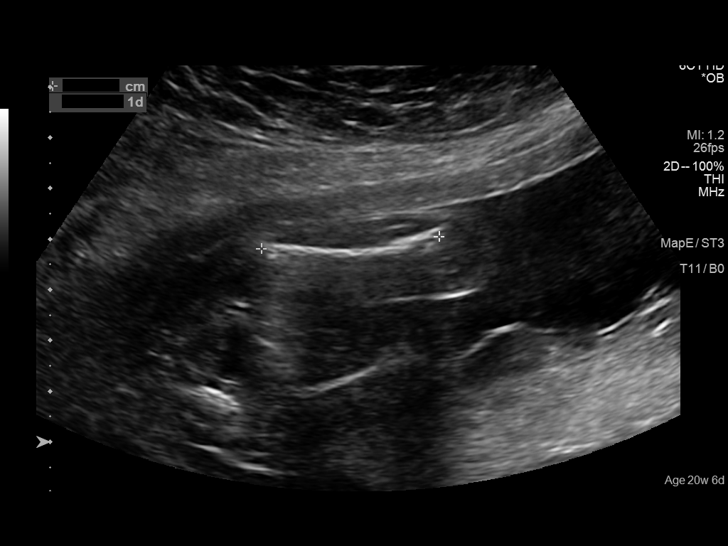
[im 56/81]
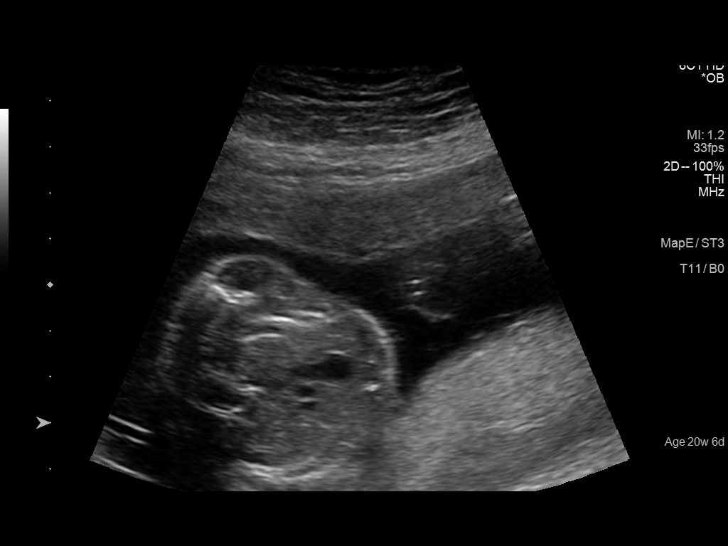
[im 62/81]
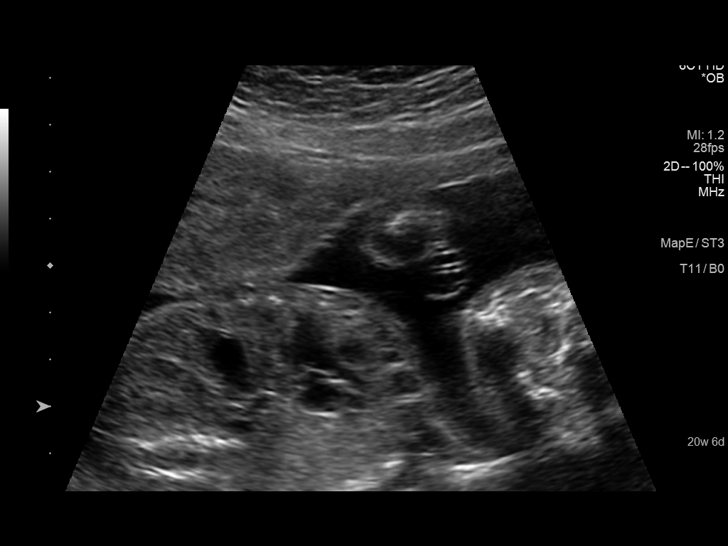
[im 68/81]
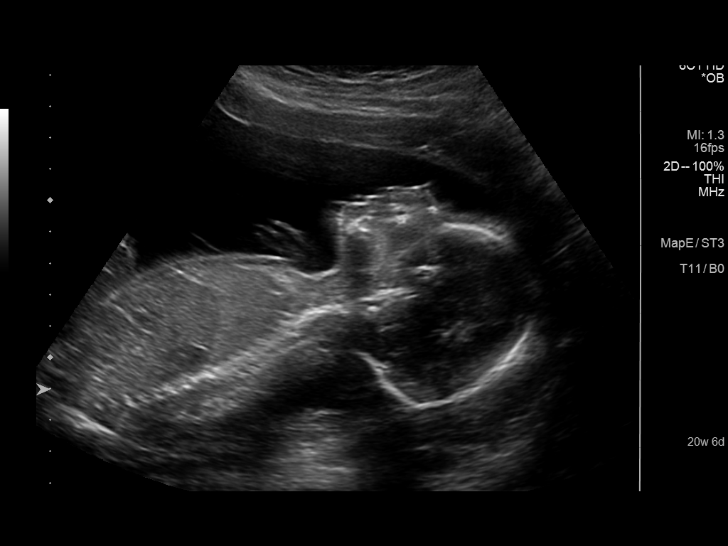
[im 74/81]
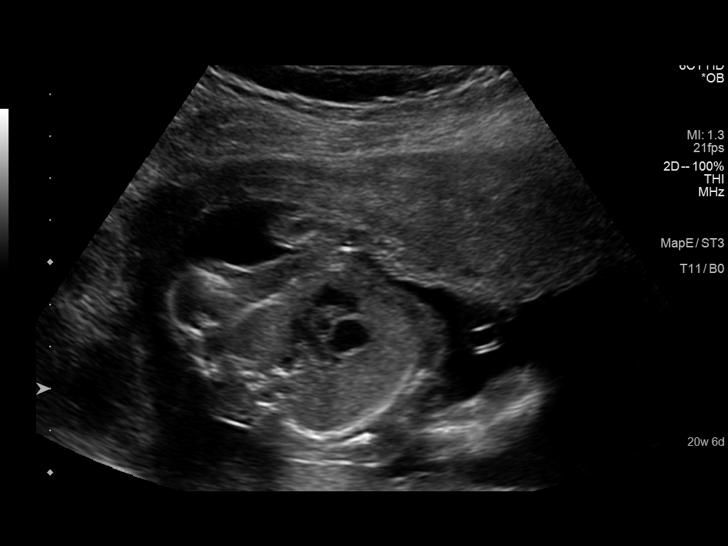
[im 81/81]
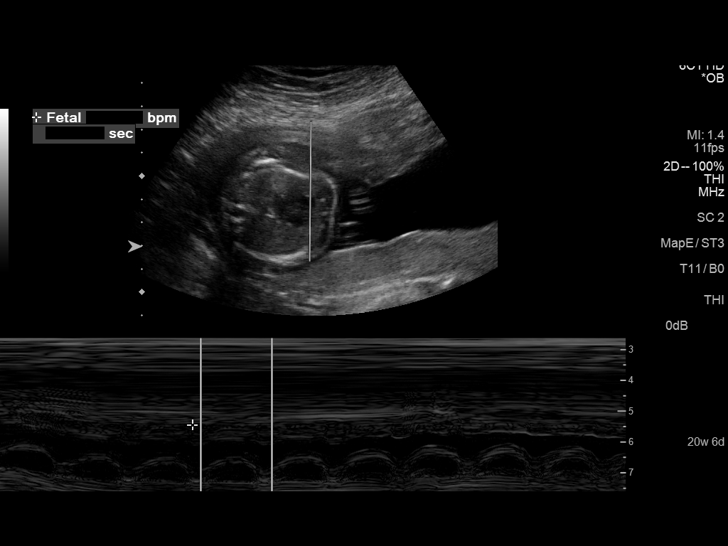

[13 of 28 positions shown; findings below may reference images not displayed]

FINDINGS: Number of Fetuses: 1

Heart Rate:  144 bpm

Movement: Yes

Presentation: Cephalic

Previa: No

Placental Location: Posterior

Amniotic Fluid (Subjective): Normal

Amniotic Fluid (Objective):

FETAL BIOMETRY

BPD: 4.9cm 20w 5d

HC:   18.6cm 20w 6d

AC:   15.4cm 20w 4d

FL:   3.5cm 21w 1d

Current Mean GA: 20w 6d US EDC: 04/03/2021

Assigned GA:  20w 6d Assigned EDC: 04/03/2021

Estimated Fetal Weight:  381g

FETAL ANATOMY

Lateral Ventricles: Appears normal

Thalami/CSP: Appears normal

Posterior Fossa:  Appears normal

Nuchal Region: Appears normal   NFT= 3.8 mm

Upper Lip: Appears normal

Spine: Appears normal

4 Chamber Heart on Left: Appears normal

LVOT: Appears normal

RVOT: Appears normal

Stomach on Left: Appears normal

3 Vessel Cord: Limited views appear normal

Cord Insertion site: Appears normal

Kidneys: Appears normal

Bladder: Appears normal

Extremities: Appears normal, 4 extremities demonstrated

Maternal Findings:

Cervix: Cervix length approximately 4.7 cm on transabdominal views,
with no evidence of internal cervical funneling.
IMPRESSION: 1. Single living intrauterine gestation at 20 weeks 6 days by
average ultrasound age, concordant with assigned dating.
2. No fetal or maternal abnormalities detected.
